# Patient Record
Sex: Female | Born: 1980 | Race: Black or African American | Hispanic: No | Marital: Married | Smoking: Never smoker
Health system: Southern US, Community
[De-identification: ages and names within clinical notes are randomized; demographics above are authoritative.]

## PROBLEM LIST (undated history)

## (undated) DIAGNOSIS — J3089 Other allergic rhinitis: Secondary | ICD-10-CM

## (undated) DIAGNOSIS — T7840XA Allergy, unspecified, initial encounter: Secondary | ICD-10-CM

## (undated) DIAGNOSIS — D259 Leiomyoma of uterus, unspecified: Secondary | ICD-10-CM

## (undated) DIAGNOSIS — J111 Influenza due to unidentified influenza virus with other respiratory manifestations: Secondary | ICD-10-CM

## (undated) DIAGNOSIS — K219 Gastro-esophageal reflux disease without esophagitis: Secondary | ICD-10-CM

## (undated) HISTORY — DX: Leiomyoma of uterus, unspecified: D25.9

## (undated) HISTORY — DX: Allergy, unspecified, initial encounter: T78.40XA

## (undated) HISTORY — DX: Gastro-esophageal reflux disease without esophagitis: K21.9

## (undated) HISTORY — PX: INTRAUTERINE DEVICE (IUD) INSERTION: SHX5877

---

## 2004-03-02 ENCOUNTER — Encounter: Payer: Self-pay | Admitting: Family Medicine

## 2004-03-02 LAB — CONVERTED CEMR LAB

## 2004-09-30 ENCOUNTER — Ambulatory Visit: Payer: Self-pay | Admitting: Family Medicine

## 2005-06-14 ENCOUNTER — Ambulatory Visit: Payer: Self-pay | Admitting: Family Medicine

## 2005-06-19 ENCOUNTER — Ambulatory Visit: Payer: Self-pay | Admitting: Family Medicine

## 2005-10-10 DIAGNOSIS — K219 Gastro-esophageal reflux disease without esophagitis: Secondary | ICD-10-CM | POA: Insufficient documentation

## 2005-10-10 DIAGNOSIS — K649 Unspecified hemorrhoids: Secondary | ICD-10-CM | POA: Insufficient documentation

## 2006-05-10 ENCOUNTER — Encounter: Payer: Self-pay | Admitting: Family Medicine

## 2006-05-10 ENCOUNTER — Ambulatory Visit: Payer: Self-pay | Admitting: Family Medicine

## 2007-05-17 ENCOUNTER — Ambulatory Visit: Payer: Self-pay | Admitting: Family Medicine

## 2007-07-16 ENCOUNTER — Ambulatory Visit: Payer: Self-pay | Admitting: Family Medicine

## 2007-07-17 ENCOUNTER — Encounter: Payer: Self-pay | Admitting: Family Medicine

## 2007-07-17 LAB — CONVERTED CEMR LAB: Clue Cells Wet Prep HPF POC: NONE SEEN

## 2008-02-27 ENCOUNTER — Ambulatory Visit: Payer: Self-pay | Admitting: Family Medicine

## 2008-06-15 ENCOUNTER — Other Ambulatory Visit: Admission: RE | Admit: 2008-06-15 | Discharge: 2008-06-15 | Payer: Self-pay | Admitting: Family Medicine

## 2008-06-15 ENCOUNTER — Encounter: Payer: Self-pay | Admitting: Family Medicine

## 2008-06-15 ENCOUNTER — Ambulatory Visit: Payer: Self-pay | Admitting: Family Medicine

## 2008-06-15 DIAGNOSIS — D239 Other benign neoplasm of skin, unspecified: Secondary | ICD-10-CM | POA: Insufficient documentation

## 2008-06-15 LAB — CONVERTED CEMR LAB
Bilirubin Urine: NEGATIVE
Blood in Urine, dipstick: NEGATIVE
Glucose, Urine, Semiquant: NEGATIVE
Nitrite: NEGATIVE
Urobilinogen, UA: 0.2

## 2008-06-16 ENCOUNTER — Encounter: Payer: Self-pay | Admitting: Family Medicine

## 2008-06-16 LAB — CONVERTED CEMR LAB: Trich, Wet Prep: NONE SEEN

## 2008-06-18 LAB — CONVERTED CEMR LAB: Pap Smear: NORMAL

## 2008-06-24 ENCOUNTER — Telehealth: Payer: Self-pay | Admitting: Family Medicine

## 2008-08-21 ENCOUNTER — Telehealth: Payer: Self-pay | Admitting: Family Medicine

## 2009-03-19 ENCOUNTER — Ambulatory Visit: Payer: Self-pay | Admitting: Family Medicine

## 2009-03-24 LAB — CONVERTED CEMR LAB
AST: 23 units/L (ref 0–37)
Albumin: 4.2 g/dL (ref 3.5–5.2)
Alkaline Phosphatase: 33 units/L — ABNORMAL LOW (ref 39–117)
BUN: 8 mg/dL (ref 6–23)
Creatinine, Ser: 0.87 mg/dL (ref 0.40–1.20)
Glucose, Bld: 80 mg/dL (ref 70–99)
HDL: 82 mg/dL (ref >39)
LDL Cholesterol: 96 mg/dL (ref 0–99)
TSH: 1.131 microintl units/mL (ref 0.350–4.500)
Total Bilirubin: 0.3 mg/dL (ref 0.3–1.2)
Total CHOL/HDL Ratio: 2.3
Triglycerides: 56 mg/dL (ref <150)
VLDL: 11 mg/dL (ref 0–40)

## 2009-03-29 ENCOUNTER — Telehealth: Payer: Self-pay | Admitting: Family Medicine

## 2009-04-03 ENCOUNTER — Ambulatory Visit: Payer: Self-pay | Admitting: Family Medicine

## 2009-04-03 DIAGNOSIS — B3731 Acute candidiasis of vulva and vagina: Secondary | ICD-10-CM | POA: Insufficient documentation

## 2009-04-03 DIAGNOSIS — B373 Candidiasis of vulva and vagina: Secondary | ICD-10-CM | POA: Insufficient documentation

## 2009-04-03 DIAGNOSIS — N760 Acute vaginitis: Secondary | ICD-10-CM | POA: Insufficient documentation

## 2009-04-19 ENCOUNTER — Ambulatory Visit: Payer: Self-pay | Admitting: Occupational Medicine

## 2009-04-21 ENCOUNTER — Encounter: Payer: Self-pay | Admitting: Occupational Medicine

## 2009-04-26 ENCOUNTER — Telehealth: Payer: Self-pay | Admitting: Occupational Medicine

## 2009-11-02 ENCOUNTER — Ambulatory Visit: Payer: Self-pay | Admitting: Family Medicine

## 2009-11-02 DIAGNOSIS — J019 Acute sinusitis, unspecified: Secondary | ICD-10-CM | POA: Insufficient documentation

## 2010-02-01 NOTE — Assessment & Plan Note (Signed)
Summary: CPX- with a pap   Vital Signs:  Patient profile:   30 year old female Height:      65.5 inches Weight:      155 pounds BMI:     25.49 Pulse rate:   65 / minute BP sitting:   100 / 70  (left arm) Cuff size:   regular  Vitals Entered By: Kathlene November (March 19, 2009 8:29 AM) CC: CPE no pap   Primary Care Provider:  Linford Arnold, C  CC:  CPE no pap.  History of Present Illness: has had some nasal congestion, runny nose, and sneezing for about 2 days.  Did take a claritin yesterday and felt some better. No fever. Does have a scratchy throat.    Feels the need for BM after eating. C/o of some abdominal cramping and gas. Feels she gets Haematologist and denies any constipation or diarrhea or blood in the stools. has had some mucous.   Current Medications (verified): 1)  Tri-Sprintec 0.18/0.215/0.25 Mg-35 Mcg Tabs (Norgestim-Eth Estrad Triphasic) .... Take 1 Tablet By Mouth Once A Day  Allergies (verified): No Known Drug Allergies  Comments:  Nurse/Medical Assistant: The patient's medications and allergies were reviewed with the patient and were updated in the Medication and Allergy Lists. 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  Has 45 yr old son. Never smoked.  Drinks 1-2 EtoH per month.  Daily Caffeine.  No drugs.  +  regular exercise.  Review of Systems  The patient denies anorexia, fever, weight loss, weight gain, vision loss, decreased hearing, hoarseness, chest pain, syncope, dyspnea on exertion, peripheral edema, prolonged cough, headaches, hemoptysis, abdominal pain, melena, hematochezia, severe indigestion/heartburn, hematuria, incontinence, genital sores, muscle  weakness, suspicious skin lesions, transient blindness, difficulty walking, depression, unusual weight change, abnormal bleeding, enlarged lymph nodes, and breast masses.         Mucous in the stool. No travel outside the country. Does have hemorroids. No constipation or diarrhea.   Physical Exam  General:  Well-developed,well-nourished,in no acute distress; alert,appropriate and cooperative throughout examination Head:  Normocephalic and atraumatic without obvious abnormalities. No apparent alopecia or balding. Eyes:  No corneal or conjunctival inflammation noted. EOMI. Perrla.  Ears:  External ear exam shows no significant lesions or deformities.  Otoscopic examination reveals clear canals, tympanic membranes are intact bilaterally without bulging, retraction, inflammation or discharge. Hearing is grossly normal bilaterally. Nose:  External nasal examination shows no deformity or inflammation. Nasal mucosa are pink and moist without lesions or exudates. Trubinate are very swollen.  Mouth:  Oral mucosa and oropharynx without lesions or exudates.  Teeth in good repair. Neck:  No deformities, masses, or tenderness noted. Chest Wall:  No deformities, masses, or tenderness noted. Breasts:  No mass, nodules, thickening, tenderness, bulging, retraction, inflamation, nipple discharge or skin changes noted.   Lungs:  Normal respiratory effort, chest expands symmetrically. Lungs are clear to auscultation, no crackles or wheezes. Heart:  Normal rate and regular rhythm. S1 and S2 normal without gallop, murmur, click, rub or other extra sounds. Abdomen:  Bowel sounds positive,abdomen soft and non-tender without masses, organomegaly or hernias noted. Msk:  No deformity or scoliosis noted of thoracic or lumbar spine.   Pulses:  R and L carotid,radial,dorsalis pedis  and posterior tibial pulses are full and equal bilaterally Extremities:  No clubbing, cyanosis, edema, or deformity noted with normal full range  of motion of all joints.   Neurologic:  No cranial nerve deficits noted. Station and gait are normal.DTRs are symmetrical throughout. Sensory, motor and coordinative functions appear intact. Skin:  no rashes.   Cervical Nodes:  No lymphadenopathy noted Axillary Nodes:  No palpable lymphadenopathy Psych:  Cognition and judgment appear intact. Alert and cooperative with normal attention span and concentration. No apparent delusions, illusions, hallucinations   Impression & Recommendations:  Problem # 1:  HEALTH SCREENING (ICD-V70.0) Due for pap next year. Screening labs are due.  Vaccines are up to date.   Recommend trial of claritin or zyrtec for allergic sxs .  Keep up exercise and healthy diet.  Orders: T-Comprehensive Metabolic Panel [id number]) T-Lipid Profile [id number]) T-TSH [id number])  Problem # 2:  ABDOMINAL CRAMPS (ICD-789.00) Will rule out gluten intolerance and lactose intolerance. Also recommend start a probiotic, called align.   Orders: T-Gliadin Peptide Antibodies, IgG, LgA ([id number]) T- * Misc. Laboratory test [id number])  Complete Medication List: 1)  Tri-sprintec 0.18/0.215/0.25 Mg-35 Mcg Tabs (Norgestim-eth estrad triphasic) .... Take 1 tablet by mouth once a day  Patient Instructions: 1)  Keep up the great exercise and healthy diet 2)  We will call you with your lab results  3)  Take calcium +vitamin D daily or 4 servings of dairy a day.  Prescriptions: TRI-SPRINTEC 0.18/0.215/0.25 MG-35 MCG TABS (NORGESTIM-ETH ESTRAD TRIPHASIC) Take 1 tablet by mouth once a day  #84 x 3   Entered and Authorized by:   Nani Gasser MD   Signed by:   Nani Gasser MD on 03/19/2009   Method used:   [facility] to        [facility] [phone]* (retail)       [address] [place], [place]  [address]       Ph: [phone] or [phone]       Fax: [phone]   RxID:   [id number]   Flu Vaccine Result Date:  10/02/2008 Flu Vaccine Result:   given Flu Vaccine Next Due:  1 yr

## 2010-02-01 NOTE — Assessment & Plan Note (Signed)
Summary: VAGINAL DISCHARGE/TJ   Vital Signs:  Patient Profile:   30 Years Old Female CC:      intermitent vaginal discharge X 1 month Height:     65.5 inches (166.37 cm) Weight:      160 pounds O2 Sat:      100 % O2 treatment:    Room Air Temp:     98.8 degrees F oral Pulse rate:   62 / minute Pulse rhythm:   regular Resp:     14 per minute BP sitting:   109 / 73  (right arm) Cuff size:   regular  Pt. in pain?   no  Vitals Entered By: Lajean Saver RN (April 19, 2009 5:23 PM)                   Updated Prior Medication List: TRI-SPRINTEC 0.18/0.215/0.25 MG-35 MCG TABS (NORGESTIM-ETH ESTRAD TRIPHASIC) Take 1 tablet by mouth once a day FLUCONAZOLE 150 MG TABS (FLUCONAZOLE) Take 1 tablet by mouth once a day x 1 METRONIDAZOLE 500 MG TABS (METRONIDAZOLE) One by mouth bid  Current Allergies (reviewed today): No known allergies History of Present Illness Chief Complaint: intermitent vaginal discharge X 1 month History of Present Illness: Very pleasant 29 YO presents with a painless thick white vaginal discharge intermittently for the past month.   She was treated successfully with diflucan, teraxol 7 vaginal cream and flagyl.  Denies thin, smelly discharge.   Scant itching.  No pelvic pain or fever.   No dysuria.    Also wants to get Pap smear while we are doing the pelvic exam.   REVIEW OF SYSTEMS Constitutional Symptoms      Denies fever, chills, night sweats, weight loss, weight gain, and fatigue.  Eyes       Denies change in vision, eye pain, eye discharge, glasses, contact lenses, and eye surgery. Ear/Nose/Throat/Mouth       Denies hearing loss/aids, change in hearing, ear pain, ear discharge, dizziness, frequent runny nose, frequent nose bleeds, sinus problems, sore throat, hoarseness, and tooth pain or bleeding.  Respiratory       Denies dry cough, productive cough, wheezing, shortness of breath, asthma, bronchitis, and emphysema/COPD.  Cardiovascular       Denies  murmurs, chest pain, and tires easily with exhertion.    Gastrointestinal       Denies stomach pain, nausea/vomiting, diarrhea, constipation, blood in bowel movements, and indigestion. Genitourniary       Complains of blood or discharge from vagina.      Denies painful urination, kidney stones, and loss of urinary control.      Comments: white Neurological       Denies paralysis, seizures, and fainting/blackouts. Musculoskeletal       Denies muscle pain, joint pain, joint stiffness, decreased range of motion, redness, swelling, muscle weakness, and gout.  Skin       Denies bruising, unusual mles/lumps or sores, and hair/skin or nail changes.  Psych       Denies mood changes, temper/anger issues, anxiety/stress, speech problems, depression, and sleep problems. Other Comments: patient was recently treated for vaginal infection and yeast infection 2 weeks ago. Majority of her symptoms have cleared but she still c/o white discharge and painful intercourse at times   Past History:  Past Medical History: Reviewed history from 10/10/2005 and no changes required. G1 P1  Past Surgical History: Reviewed history from 10/10/2005 and no changes required. _  Family History: Reviewed history from 03/19/2009 and no changes  required. Mother-Psych problems Parents, siblings-HTN  Social History: Reviewed history from 03/19/2009 and no changes required. 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  Has 36 yr old son. Never smoked.  Drinks 1-2 EtoH per month.  Daily Caffeine.  No drugs.  +  regular exercise. Physical Exam Heart: regular rate and  rhythm, no murmur Abdomen: soft, non-tender without obvious organomegaly GU: normal Pelvic exam.:   Cervix visualized.   Thick white discharge noted.  Cervix appears normal.  No odors.    Plan New Medications/Changes: FLUCONAZOLE 150 MG TABS (FLUCONAZOLE) Take 1 tablet by mouth once a day x 1  #1 x 3, 04/19/2009, Kathrine Haddock MD  New  Orders: Est. Patient Level III [99213] T-KOH Prep Fungal [[id number]] T-Pap Smear, Thin Prep [09811] Planning Comments:   diflucan 150 once for yeast infection Recommend pro biotic yogurt Recommend cotton panties Pap Smear done at patient's request.  Follow up as needed.   The patient and/or caregiver has been counseled thoroughly with regard to medications prescribed including dosage, schedule, interactions, rationale for use, and possible side effects and they verbalize understanding.  Diagnoses and expected course of recovery discussed and will return if not improved as expected or if the condition worsens. Patient and/or caregiver verbalized understanding.  Prescriptions: FLUCONAZOLE 150 MG TABS (FLUCONAZOLE) Take 1 tablet by mouth once a day x 1  #1 x 3   Entered and Authorized by:   Kathrine Haddock MD   Signed by:   Kathrine Haddock MD on 04/19/2009   Method used:   [facility]ically to        [facility] [phone]* (retail)       [address] [place], [place]  [address]       Ph: [phone] or [phone]       Fax: [phone]   RxID:   [id number]

## 2010-02-01 NOTE — Assessment & Plan Note (Signed)
Summary: Sinusitis   Vital Signs:  Patient profile:   30 year old female Height:      65.5 inches Weight:      158 pounds Temp:     99.0 degrees F oral Pulse rate:   63 / minute BP sitting:   115 / 78  (right arm) Cuff size:   regular  Vitals Entered By: Avon Gully CMA, Duncan Dull) (November 02, 2009 8:26 AM) CC: sore throat since wed, congestion and runny nose   Primary Care Provider:  Linford Arnold, C  CC:  sore throat since wed and congestion and runny nose.  History of Present Illness: sore throat since wed (7 days), congestion and runny nose. ST resolved initally but now has started again.  No fever. Some facial pressure and pain on the left side.  No nausea, change in appetite or loose stools.  Felt like somithing in her chest. Slight cough and sneezing.  DRy cough.  Tried several OTC cough meds - none helping. Feels like she is getting someworse. No fatigue or myalgias.   Current Medications (verified): 1)  Tri-Sprintec 0.18/0.215/0.25 Mg-35 Mcg Tabs (Norgestim-Eth Estrad Triphasic) .... Take 1 Tablet By Mouth Once A Day  Allergies (verified): No Known Drug Allergies  Comments:  Nurse/Medical Assistant: The patient's medications and allergies were reviewed with the patient and were updated in the Medication and Allergy Lists. Avon Gully CMA, Duncan Dull) (November 02, 2009 8:27 AM)  Physical Exam  General:  Well-developed,well-nourished,in no acute distress; alert,appropriate and cooperative throughout examination Head:  Normocephalic and atraumatic without obvious abnormalities. No apparent alopecia or balding. Eyes:  No corneal or conjunctival inflammation noted. EOMI. Perrla.  Ears:  External ear exam shows no significant lesions or deformities.  Otoscopic examination reveals clear canals, tympanic membranes are intact bilaterally without bulging, retraction, inflammation or discharge. Hearing is grossly normal bilaterally. Nose:  External nasal examination shows no  deformity or inflammation. Nasal mucosa are pink and moist without lesions or exudates. Mouth:  Oral mucosa and oropharynx without lesions or exudates.  Teeth in good repair. Neck:  No deformities, masses, or tenderness noted. Lungs:  Normal respiratory effort, chest expands symmetrically. Lungs are clear to auscultation, no crackles or wheezes. Heart:  Normal rate and regular rhythm. S1 and S2 normal without gallop, murmur, click, rub or other extra sounds. Skin:  no rashes.   Cervical Nodes:  No lymphadenopathy noted Psych:  Cognition and judgment appear intact. Alert and cooperative with normal attention span and concentration. No apparent delusions, illusions, hallucinations   Impression & Recommendations:  Problem # 1:  SINUSITIS - ACUTE-NOS (ICD-461.9) If not feeling bettter in a couple of days then let me know.  The following medications were removed from the medication list:    Metronidazole 500 Mg Tabs (Metronidazole) ..... One by mouth bid Her updated medication list for this problem includes:    Amoxicillin 875 Mg Tabs (Amoxicillin) .Marland Kitchen... Take 1 tablet by mouth two times a day for 10 days  Instructed on treatment. Call if symptoms persist or worsen.   Complete Medication List: 1)  Tri-sprintec 0.18/0.215/0.25 Mg-35 Mcg Tabs (Norgestim-eth estrad triphasic) .... Take 1 tablet by mouth once a day 2)  Amoxicillin 875 Mg Tabs (Amoxicillin) .... Take 1 tablet by mouth two times a day for 10 days  Patient Instructions: 1)  Call if get worse.  Can you OTC cough medicines if needed.  Prescriptions: AMOXICILLIN 875 MG TABS (AMOXICILLIN) Take 1 tablet by mouth two times a day for  10 days  #20 x 0   Entered and Authorized by:   Nani Gasser MD   Signed by:   Nani Gasser MD on 11/02/2009   Method used:   [facility]ronically to        [facility] [phone]* (retail)       [address] [place], [place]  [address]       Ph: [phone] or [phone]       Fax:  [phone]   RxID:   [id number]    Orders Added: 1)  Est. Patient Level III [36644]   Immunization History:  Influenza Immunization History:    Influenza:  historical (10/11/2009)   Immunization History:  Influenza Immunization History:    Influenza:  Historical (10/11/2009)   Immunization History:  Influenza Immunization History:    Influenza:  historical (10/11/2009)

## 2010-02-01 NOTE — Assessment & Plan Note (Signed)
Summary: Yeast infection cleared up rm 1   Vital Signs:  Patient Profile:   30 Years Old Female CC:      Yeast infection cleard up? LMP:     03/30/2009 Height:     65.5 inches (166.37 cm) Weight:      158 pounds O2 Sat:      99 % O2 treatment:    Room Air Temp:     98.1 degrees F oral Pulse rate:   69 / minute Pulse rhythm:   regular Resp:     16 per minute BP sitting:   108 / 74  (right arm) Cuff size:   regular  Vitals Entered By: Areta Haber CMA (April 03, 2009 10:40 AM)  Menstrual History: LMP (date): 03/30/2009 LMP - Character: light                  Current Allergies: No known allergies History of Present Illness Chief Complaint: Yeast infection cleard up? History of Present Illness: Subjective:  Patient complains of two week history of "clumpy" whitish vaginal discharge without pain.  She tried OTC Monistat without complete resolution, and then was prescribed Diflucan (single dose) 5 days ago.  She then started her menses, which is now almost ended, but she believes she still has some mild infection.  No urinary symptoms.  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  Eyes       Denies change in vision, eye pain, eye discharge, glasses, contact lenses, and eye surgery. Ear/Nose/Throat/Mouth       Denies hearing loss/aids,  change in hearing, ear pain, ear discharge, dizziness, frequent runny nose, frequent nose bleeds, sinus problems, sore throat, hoarseness, and tooth pain or bleeding.  Respiratory       Denies dry cough, productive cough, wheezing, shortness of breath, asthma, bronchitis, and emphysema/COPD.  Cardiovascular       Denies murmurs, chest pain, and tires easily with exhertion.    Gastrointestinal       Denies stomach pain, nausea/vomiting, diarrhea, constipation, blood in bowel movements, and indigestion. Genitourniary       Complains of blood or discharge from vagina.      Denies painful urination, kidney stones, and loss of urinary control.      Comments: slight Neurological       Denies paralysis, seizures, and fainting/blackouts. Musculoskeletal       Denies muscle pain, joint pain, joint stiffness, decreased range of motion, redness, swelling, muscle weakness, and gout.  Skin       Denies bruising, unusual mles/lumps or sores, and hair/skin or nail changes.  Psych       Denies mood changes, temper/anger issues, anxiety/stress, speech problems, depression, and sleep problems. Other Comments: Pt just wants to make sure yeast infection has cleared up.     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  Has 52 yr old son. Never smoked.  Drinks 1-2 EtoH per month.  Daily Caffeine.  No drugs.  +  regular exercise.  Risk Factors: Smoking Status: never (05/10/2006)   Objective:  Appearance:  Patient appears healthy, stated age, and in no acute distress  Lungs:  Clear to auscultation.  Breath sounds are equal.  Heart:  Regular rate and rhythm without murmurs, rubs, or gallops.  Abdomen:  Nontender without masses or  hepatosplenomegaly.  Bowel sounds are present.  No CVA or flank tenderness.  Genitourinary:  Vulva appears normal without lesions or erythema.  Vagina has normal mucosae without lesions.  There is a white curd-like discharge in the vault next to cervix.   Cervix appears normal without lesions.  There is small amount of residual menstrual blood in cervical os.   No cervical motion tenderness present. Uterus is small and nontender.  Adnexae are nontender without massess.  pH of vaginal discharge 5.0 KOH and Wet Prep:  branching yeast present.  Clue cells present. Assessment New Problems: BACTERIAL VAGINITIS (ICD-616.10) CANDIDIASIS, VAGINAL (ICD-112.1)  PERSISTENT CANDIDA VAGINITIS WITH BACTERIAL VAGINOSIS  Plan New Medications/Changes: TERAZOL 7 0.4 % CREA (TERCONAZOLE) 1 applicatorful PV at bedtime for one week  #1 tube x 0, 04/03/2009, Donna Christen MD METRONIDAZOLE 500 MG TABS (METRONIDAZOLE) One by mouth bid  #14 x 0, 04/03/2009, Donna Christen MD  New Orders: New Patient Level III [99203] [facility] [[id number]] Planning Comments:   Will treat with Terazol 7 vaginal cream for one week, and oral Flagyl for one week. Follow-up with PCP   The patient and/or caregiver has been counseled thoroughly with regard to medications prescribed including dosage, schedule, interactions, rationale for use, and possible side effects and they verbalize understanding.  Diagnoses and expected course of recovery discussed and will return if not improved as expected or if the condition worsens. Patient and/or caregiver verbalized understanding.  Prescriptions: TERAZOL 7 0.4 % CREA (TERCONAZOLE) 1 applicatorful PV at bedtime for one week  #1 tube x 0   Entered and Authorized by:   Donna Christen MD   Signed by:   Donna Christen MD on 04/03/2009   Method used:   Print then Give to Patient   RxID:   [id number] METRONIDAZOLE 500 MG TABS (METRONIDAZOLE) One by mouth bid  #14 x 0   Entered and Authorized  by:   Donna Christen MD   Signed by:   Donna Christen MD on 04/03/2009   Method used:   Print then Give to Patient   RxID:   [id number]

## 2010-02-01 NOTE — Progress Notes (Signed)
Summary: Yeast infection  Phone Note Call from Patient Call back at [phone]   Caller: Patient Call For: Nani Gasser MD Summary of Call: Pt calls and states has yeast infection and used the OTC med but did not help all that much and wanted to know if you would call her in [facility] [address] [facility] [place] Initial call taken by: Kathlene November,  March 29, 2009 10:21 AM  Follow-up for Phone Call        Pt notified med sent Follow-up by: Kathlene November,  March 29, 2009 10:55 AM    New/Updated Medications: FLUCONAZOLE 150 MG TABS (FLUCONAZOLE) Take 1 tablet by mouth once a day x 1 Prescriptions: FLUCONAZOLE 150 MG TABS (FLUCONAZOLE) Take 1 tablet by mouth once a day x 1  #1 x 0   Entered and Authorized by:   Nani Gasser MD   Signed by:   Nani Gasser MD on 03/29/2009   Method used:   [facility]ronically to        [facility] [phone]* (retail)       [address] [place], [place]  [address]       Ph: [phone] or [phone]       Fax: [phone]   RxID:   [id number]

## 2010-02-01 NOTE — Progress Notes (Signed)
  Phone Note Call from Patient   Caller: Patient Reason for Call: Talk to Nurse Summary of Call: Paitent wants the results back from the test that was done. Please call and give result to patient [phone]. Nh  Called number listed above.   This number is disconnected.;  I called the work and home number and left a message that her results were normal.   Halford Chessman MD>  Initial call taken by: Dannette Barbara,  April 26, 2009 8:16 AM

## 2010-06-14 ENCOUNTER — Encounter: Payer: Self-pay | Admitting: Emergency Medicine

## 2010-06-14 ENCOUNTER — Inpatient Hospital Stay (INDEPENDENT_AMBULATORY_CARE_PROVIDER_SITE_OTHER)
Admission: RE | Admit: 2010-06-14 | Discharge: 2010-06-14 | Disposition: A | Discharge status: Home / Self Care | Payer: BC Managed Care – PPO | Source: Ambulatory Visit | Attending: Emergency Medicine | Admitting: Emergency Medicine

## 2010-06-14 DIAGNOSIS — N760 Acute vaginitis: Secondary | ICD-10-CM

## 2010-12-05 NOTE — Progress Notes (Signed)
Summary: possible yeast infection rm 5   Vital Signs:  Patient Profile:   30 Years Old Female CC:      vaginal d/c x 1 1/2 wk LMP:     05/31/2010 Height:     65.5 inches (166.37 cm) Weight:      163.75 pounds O2 Sat:      99 % O2 treatment:    Room Air Temp:     99.4 degrees F oral Pulse rate:   16 / minute Resp:     16 per minute BP sitting:   119 / 83  (left arm) Cuff size:   regular  Vitals Entered By: Clemens Catholic LPN (June 14, 2010 4:20 PM)  Menstrual History: LMP (date): 05/31/2010                  Updated Prior Medication List: TRI-SPRINTEC 0.18/0.215/0.25 MG-35 MCG TABS (NORGESTIM-ETH ESTRAD TRIPHASIC) Take 1 tablet by mouth once a day  Current Allergies (reviewed today): No known allergies History of Present Illness History from: patient Chief Complaint: vaginal d/c x 1 1/2 wk History of Present Illness: Vaginal d/c for 10 days.  Went to gyn who gave her diflucan which helped a little.  She has a h/o yeast and BV in the past.  White discharge, no odor, mildly irritating.  She is monogamous with her husband with no h/o STD.  No F/C/N/V, no UTI symptoms, no back pain or abd pain.  REVIEW OF SYSTEMS Constitutional Symptoms      Denies fever, chills, night sweats, weight loss, weight gain, and fatigue.  Eyes       Denies change in vision, eye pain, eye discharge, glasses, contact lenses, and eye surgery. Ear/Nose/Throat/Mouth       Denies hearing loss/aids, change in hearing, ear pain, ear discharge, dizziness, frequent runny nose, frequent nose bleeds, sinus problems, sore throat, hoarseness, and tooth pain or bleeding.  Respiratory       Denies dry cough, productive cough, wheezing, shortness of breath, asthma, bronchitis, and emphysema/COPD.  Cardiovascular       Denies murmurs, chest pain, and tires easily with exhertion.    Gastrointestinal       Denies stomach pain, nausea/vomiting, diarrhea, constipation, blood in bowel movements, and  indigestion. Genitourniary       Complains of blood or discharge from vagina.      Denies painful urination, kidney stones, and loss of urinary control. Neurological       Denies paralysis, seizures, and fainting/blackouts. Musculoskeletal       Denies muscle pain, joint pain, joint stiffness, decreased range of motion, redness, swelling, muscle weakness, and gout.  Skin       Denies bruising, unusual mles/lumps or sores, and hair/skin or nail changes.  Psych       Denies mood changes, temper/anger issues, anxiety/stress, speech problems, depression, and sleep problems. Other Comments: pt c/o a white odorless vaginal d/c x 1 1/2 wk. she called her GYN last wk and was given diflucan which helped with the irritation but she still has the d/c. she has a hx of BV. no fever. no OTC meds.   Past History:  Past Medical History: Reviewed history from 10/10/2005 and no changes required. G1 P1  Past Surgical History: _ Denies surgical history  Family History: Reviewed history from 03/19/2009 and no changes required. Mother-Psych problems Parents, siblings-HTN Family History Diabetes 1st degree relative Family History High cholesterol obesity  Social History: Reviewed history from 03/19/2009 and no changes required.  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  Has 65 yr old son. Never smoked.  Drinks 1-2 EtoH per month.  Daily Caffeine.  No drugs.  +  regular exercise. Physical Exam General appearance: well developed, well nourished, no acute distress GU: deferred MSE: oriented to time, place, and person Assessment New Problems: FAMILY HISTORY DIABETES 1ST DEGREE RELATIVE (ICD-V18.0)   Plan New Medications/Changes: FLUCONAZOLE 150 MG TABS (FLUCONAZOLE) 1 by mouth x1 dose  #1 x 0, 06/14/2010, Hoyt Koch MD METRONIDAZOLE 500 MG TABS (METRONIDAZOLE) 1 by mouth two times a day for 7 days  #14 x 0, 06/14/2010, Hoyt Koch MD  New Orders: Est. Patient Level  II [16109] Planning Comments:   Will treat for BV with Flagyl (no EtOH) and 1 more dose of diflucan just in case there was incomplete clearing.  If still symptoms, will need to KOH/wet prep.  Increase hydration.    The patient and/or caregiver has been counseled thoroughly with regard to medications prescribed including dosage, schedule, interactions, rationale for use, and possible side effects and they verbalize understanding.  Diagnoses and expected course of recovery discussed and will return if not improved as expected or if the condition worsens. Patient and/or caregiver verbalized understanding.  Prescriptions: FLUCONAZOLE 150 MG TABS (FLUCONAZOLE) 1 by mouth x1 dose  #1 x 0   Entered and Authorized by:   Hoyt Koch MD   Signed by:   Hoyt Koch MD on 06/14/2010   Method used:   Print then Give to Patient   RxID:   [id number] METRONIDAZOLE 500 MG TABS (METRONIDAZOLE) 1 by mouth two times a day for 7 days  #14 x 0   Entered and Authorized by:   Hoyt Koch MD   Signed by:   Hoyt Koch MD on 06/14/2010   Method used:   Print then Give to Patient   RxID:   [id number]   Orders Added: 1)  Est. Patient Level II [29528]

## 2011-04-03 ENCOUNTER — Ambulatory Visit (INDEPENDENT_AMBULATORY_CARE_PROVIDER_SITE_OTHER): Payer: BC Managed Care – PPO | Admitting: Family Medicine

## 2011-04-03 VITALS — BP 118/69 | HR 63 | Temp 98.3°F | Ht 66.0 in | Wt 160.0 lb

## 2011-04-03 DIAGNOSIS — J329 Chronic sinusitis, unspecified: Secondary | ICD-10-CM

## 2011-04-03 DIAGNOSIS — J40 Bronchitis, not specified as acute or chronic: Secondary | ICD-10-CM

## 2011-04-03 MED ORDER — AMOXICILLIN-POT CLAVULANATE 875-125 MG PO TABS: 1.0000 | ORAL_TABLET | Freq: Two times a day (BID) | ORAL | Status: AC

## 2011-04-03 MED ORDER — AMBULATORY NON FORMULARY MEDICATION: Status: DC

## 2011-04-03 MED ORDER — PHENYLEPHRINE-DM-GG-APAP 5-10-200-325 MG/10ML PO LIQD: 10.0000 mL | Freq: Four times a day (QID) | ORAL | Status: DC | PRN

## 2011-04-03 NOTE — Patient Instructions (Signed)
Discharge Instructions Browse by Alphabet A B C D E F G H I J K L M N O P Q R S T U V W X Y Z  Browse by Category All Documents Allergy and Immunology Anesthesiology Bariatrics Bioterrorism Cardiology Critical Care Dentistry Dermatology Diabetes Dietary Easy-to-Read Emergency Medicine Endocrinology ENT Family Medicine Forms Gastroenterology Geriatrics Infectious Disease Internal Medicine Labs and Tests Neonatology Nephrology Neurology Obstetrics and Gynecology Oncology Ophthalmology Orthopedics Pediatrics Pharmacology Physical Medicine and Rehabilitation Podiatry Preventative Medicine Procedures Psychiatry Pulmonary Medicine Radiology Rheumatology Surgery Urology Drug Information Sheets All Drug Information Sheets  Browse by Alphabet A B C D E F G H I J K L M N O P Q R S T U V W X Y Z Discharge Instructions Browse by Alphabet A B C D E F G H I J K L M N O P Q R S T U V W X Y Z  Browse by Category All Documents Allergy and Immunology Anesthesiology [facility] Bioterrorism Cardiology Critical Care Dentistry Dermatology Diabetes Dietary Easy-to-Read Emergency Medicine Endocrinology ENT Family Medicine Forms Gastroenterology Geriatrics Infectious Disease Internal Medicine Labs and Tests Neonatology Nephrology Neurology Obstetrics and Gynecology Oncology Ophthalmology Orthopedics Pediatrics Pharmacology Physical Medicine and Rehabilitation Podiatry Preventative Medicine Procedures Psychiatry Pulmonary Medicine Radiology Rheumatology Surgery Urology Drug Information Sheets All Drug Information Sheets  Browse by Alphabet A B C D E F G H I J K L M N O P Q R S T U V W X Y Z Acute Bronchitis You have acute bronchitis. This means you have a chest cold. The airways in your lungs are red and sore (inflamed). Acute means it is sudden onset.   CAUSES Bronchitis is most often caused by the same virus that causes a cold. SYMPTOMS     Body aches.   Chest congestion.   Chills.   Cough.   Fever.   Shortness of breath.   Sore throat.  TREATMENT   Acute bronchitis is usually treated with rest, fluids, and medicines for relief of fever or cough. Most symptoms should go away after a few days or a week. Increased fluids may help thin your secretions and will prevent dehydration. Your caregiver may give you an inhaler to improve your symptoms. The inhaler reduces shortness of breath and helps control cough. You can take over-the-counter pain relievers or cough medicine to decrease coughing, pain, or fever. A cool-air vaporizer may help thin bronchial secretions and make it easier to clear your chest. Antibiotics are usually not needed but can be prescribed if you smoke, are seriously ill, have chronic lung problems, are elderly, or you are at higher risk for developing complications. Allergies and asthma can make bronchitis worse. Repeated episodes of bronchitis may cause longstanding lung problems. Avoid smoking and secondhand smoke. Exposure to cigarette smoke or irritating chemicals will make bronchitis worse. If you are a cigarette smoker, consider using nicotine gum or skin patches to help control withdrawal symptoms. Quitting smoking will help your lungs heal faster. Recovery from bronchitis is often slow, but you should start feeling better after 2 to 3 days. Cough from bronchitis frequently lasts for 3 to 4 weeks. To prevent another bout of acute bronchitis:  Quit smoking.   Wash your hands frequently to get rid of viruses or use a hand sanitizer.   Avoid other people with cold or virus symptoms.   Try not to touch your hands to your mouth, nose, or eyes.  SEEK IMMEDIATE MEDICAL CARE IF:  You develop increased fever, chills, or chest pain.   You have severe shortness of breath or bloody sputum.   You develop dehydration, fainting, repeated vomiting, or a severe headache.   You have no improvement after 1  week of treatment or you get worse.  MAKE SURE YOU:    Understand these instructions.   Will watch your condition.   Will get help right away if you are not doing well or get worse.  Document Released: 01/27/2004 Document Revised: 12/08/2010 Document Reviewed: 04/13/2010 [facility] Patient Information 2012 [place], [place].Sinusitis Sinuses are air pockets within the bones of your face. The growth of bacteria within a sinus leads to infection. The infection prevents the sinuses from draining. This infection is called sinusitis. SYMPTOMS   There will be different areas of pain depending on which sinuses have become infected.  The maxillary sinuses often produce pain beneath the eyes.   Frontal sinusitis may cause pain in the middle of the forehead and above the eyes.  Other problems (symptoms) include:  Toothaches.   Colored, pus-like (purulent) drainage from the nose.   Swelling, warmth, and tenderness over the sinus areas may be signs of infection.  TREATMENT   Sinusitis is most often determined by an exam.X-rays may be taken. If x-rays have been taken, make sure you obtain your results or find out how you are to obtain them. Your caregiver may give you medications (antibiotics). These are medications that will help kill the bacteria causing the infection. You may also be given a medication (decongestant) that helps to reduce sinus swelling.   HOME CARE INSTRUCTIONS    Only take over-the-counter or prescription medicines for pain, discomfort, or fever as directed by your caregiver.   Drink extra fluids. Fluids help thin the mucus so your sinuses can drain more easily.   Applying either moist heat or ice packs to the sinus areas may help relieve discomfort.   Use saline nasal sprays to help moisten your sinuses. The sprays can be found at your local drugstore.  SEEK IMMEDIATE MEDICAL CARE IF:  You have a fever.   You have increasing pain, severe headaches, or toothache.   You  have nausea, vomiting, or drowsiness.   You develop unusual swelling around the face or trouble seeing.  MAKE SURE YOU:    Understand these instructions.   Will watch your condition.   Will get help right away if you are not doing well or get worse.  Document Released: 12/19/2004 Document Revised: 12/08/2010 Document Reviewed: 07/18/2006 [facility] Patient Information 2012 [place], [place].

## 2011-04-03 NOTE — Progress Notes (Signed)
  Subjective:    Patient ID: Melanie Glass, female    DOB: 02/02/1980, 31 y.o.   MRN: [id number]  HPI  6 days of productive cough and nasal congestion. No fever. Feels tired. Worse at night. Using mucinex- helpos some. Says she feels she is getting worse. Some HA. Started in the sinuses. Took a claritin one night.    Review of Systems     Objective:   Physical Exam  Constitutional: She is oriented to person, place, and time. She appears well-developed and well-nourished.  HENT:  Head: Normocephalic and atraumatic.  Right Ear: External ear normal.  Left Ear: External ear normal.  Nose: Nose normal.  Mouth/Throat: Oropharynx is clear and moist.       TMs and canals are clear.   Eyes: Conjunctivae and EOM are normal. Pupils are equal, round, and reactive to light.  Neck: Neck supple. No thyromegaly present.  Cardiovascular: Normal rate, regular rhythm and normal heart sounds.   Pulmonary/Chest: Effort normal and breath sounds normal. She has no wheezes.  Lymphadenopathy:    She has no cervical adenopathy.  Neurological: She is alert and oriented to person, place, and time.  Skin: Skin is warm and dry.  Psychiatric: She has a normal mood and affect.          Assessment & Plan:  Sinusitis/Bronchitis - Since 6 days and she feels she is getting worse. Will tx with augmentin x 10 days for sinusitis.  Call if not better in one week.  Symptomatic care.

## 2011-06-19 ENCOUNTER — Ambulatory Visit (INDEPENDENT_AMBULATORY_CARE_PROVIDER_SITE_OTHER): Payer: BC Managed Care – PPO | Admitting: Family Medicine

## 2011-06-19 ENCOUNTER — Encounter: Payer: Self-pay | Admitting: Family Medicine

## 2011-06-19 VITALS — BP 113/70 | HR 75 | Wt 161.0 lb

## 2011-06-19 DIAGNOSIS — K645 Perianal venous thrombosis: Secondary | ICD-10-CM

## 2011-06-19 MED ORDER — LIDOCAINE-HYDROCORTISONE ACE 3-0.5 % RE CREA: 1.0000 | TOPICAL_CREAM | Freq: Two times a day (BID) | RECTAL | Status: DC

## 2011-06-19 NOTE — Patient Instructions (Signed)
Hemorrhoids Hemorrhoids are veins in the rectum that get big. These veins can get blocked. Blocked veins become puffy (swollen) and painful. HOME CARE  Eat more fiber.   Drink enough fluid to keep your pee (urine) clear or pale yellow.   Exercise often.   Avoid straining to poop (bowel movement).   Keep the butt area dry and clean.   Only take medicine as told by your doctor.  If your hemorrhoids are puffy and painful:  Take a warm bath for 20 to 30 minutes. Do this 3 to 4 times a day.   Place ice packs on the area. Use the ice packs between the baths.   Put ice in a plastic bag.   Place a towel between your skin and the bag.   Leave the ice on for 15 to 20 minutes, 3 to 4 times a day.   Do not use a donut-shaped pillow. Do not sit on the toilet for a long time.   Go to the bathroom when your body has the urge to poop. This is so you do not strain as much to poop.  GET HELP RIGHT AWAY IF:   You have increasing pain that is not controlled with medicine.   You have uncontrolled bleeding.   You cannot poop.   You have pain or puffiness outside the area of the hemorrhoids.   You have chills.   You have a temperature by mouth above 102 F (38.9 C), not controlled by medicine.  MAKE SURE YOU:   Understand these instructions.   Will watch your condition.   Will get help right away if you are not doing well or get worse.  Document Released: 09/28/2007 Document Revised: 12/08/2010 Document Reviewed: 09/28/2007 [employer] Patient Information 2012 [employer]. 

## 2011-06-19 NOTE — Progress Notes (Signed)
  Subjective:    Patient ID: Melanie Glass, female    DOB: 1980/02/15, 31 y.o.   MRN: [id number]  HPI Hemorrhoid flaring for one week. Using preparation H OTC and helping some.  Has been worse the last 3 days. Says they are swollen, painful, and no bleeding. They have bothered her on and off for years. She's never had any prescription topical medication to apply.   Review of Systems     Objective:   Physical Exam  She has a 1 cm swollen hemorrhoid at the Position at 9:00. She has a second hemorrhoid at about the 3:00 position that is not acutely inflamed. Normal rectal tone. No evidence of erythema or cellulitis.      Assessment & Plan:  Acutely thrombosed hemorrhoid - Discussed options of sitz baths, creams and lancing. She preferred to have the lesion limits today. I explained how we would lance the lesion and that sometimes we are unable to express any blood clots. Patient said she wanted to have the I&D. Then a prescription for Anamantel.  Continue with sitz baths at home. Call if not significantly improved in the next 2-3 days.  Incision Thrombosed Hemorrhoid Procedure Note  Pre-operative Diagnosis: Thrombosed external hemorrhoid  Post-operative Diagnosis: Thrombosed external hemorrhoid  Locations: 9:00 position  Indications: Painful external hemorrhoid. Explained surgical vs conservative treatment; surgical incision and drainage of clot usually works quickly to reduce pain and shorten healing time, but is uncomfortable to perform.  After discussion, the patient wishes to proceed with this procedure.  Anesthesia: Lidocaine 1% without epinephrine without added sodium bicarbonate  Procedure Details  After adequate anesthesia, an elliptical incision was made, and no visible clot was extruded. Some blood was extruded. A hemorrhoid reduced in size significantly. This was well tolerated. Bulky dressing is applied.  Complications: none.  Plan: 1. Very frequent Sitz baths. 2. Colace  tid prn and increase fluids to prevent constipation.   3. Call or return to clinic prn if these symptoms worsen or fail to improve as anticipated

## 2011-06-28 ENCOUNTER — Encounter: Payer: Self-pay | Admitting: *Deleted

## 2012-04-05 ENCOUNTER — Ambulatory Visit (INDEPENDENT_AMBULATORY_CARE_PROVIDER_SITE_OTHER): Payer: BC Managed Care – PPO

## 2012-04-05 ENCOUNTER — Ambulatory Visit (INDEPENDENT_AMBULATORY_CARE_PROVIDER_SITE_OTHER): Payer: BC Managed Care – PPO | Admitting: Physician Assistant

## 2012-04-05 ENCOUNTER — Encounter: Payer: Self-pay | Admitting: Physician Assistant

## 2012-04-05 VITALS — BP 129/81 | HR 81 | Wt 166.0 lb

## 2012-04-05 DIAGNOSIS — R109 Unspecified abdominal pain: Secondary | ICD-10-CM

## 2012-04-05 DIAGNOSIS — R0781 Pleurodynia: Secondary | ICD-10-CM

## 2012-04-05 DIAGNOSIS — R350 Frequency of micturition: Secondary | ICD-10-CM

## 2012-04-05 DIAGNOSIS — R079 Chest pain, unspecified: Secondary | ICD-10-CM

## 2012-04-05 LAB — POCT URINALYSIS DIPSTICK
Blood, UA: NEGATIVE
Glucose, UA: NEGATIVE
Nitrite, UA: NEGATIVE
Spec Grav, UA: 1.01
Urobilinogen, UA: 0.2
pH, UA: 6.5

## 2012-04-05 MED ORDER — CYCLOBENZAPRINE HCL 5 MG PO TABS: 5.0000 mg | ORAL_TABLET | Freq: Two times a day (BID) | ORAL | Status: DC | PRN

## 2012-04-05 NOTE — Progress Notes (Signed)
  Subjective:    Patient ID: Melanie Glass, female    DOB: 1980/03/01, 32 y.o.   MRN: [id number]  HPI Patient is a 32 yo female who presents to the clinic with left lower rib and flank pain. Pain started on Monday morning she noticed her left rib and back area were sore and tender to touch. No rash. She couldn't wear her bra with underwire. Pain has not worsened but continued. Last night she tried to lay on her left side and felt pressure. Pain is worse with twisting or streching. She feels like she feels a lump off her left rib. No increased pain with cough or deep breathing. NO known trauma. She plays volleyball weekly and pain started before volleyball. She took NSAIDS and seems to help some. Denies any urinary pain or discomfort. She has had some increased frequency. She does drink a lot of water but going to bathroom at least 2 times in middle of night and 5-6 times during the day. Denies any abdominal pain, n/v/d, or fever, chills. Pt mentioned low WBC count at OB/GYN visit.      Review of Systems     Objective:   Physical Exam  Constitutional: She is oriented to person, place, and time. She appears well-developed and well-nourished.  HENT:  Head: Normocephalic and atraumatic.  Cardiovascular: Normal rate, regular rhythm and normal heart sounds.   Pulmonary/Chest: Effort normal and breath sounds normal.  No CVA tenderness.   Pain with palpation over 11th rib and a small nodule felt on exam about size of a marble that is firm but mobile and tender to touch.  Abdominal: Soft. Bowel sounds are normal. She exhibits no distension. There is no tenderness. There is no rebound and no guarding.  Neurological: She is alert and oriented to person, place, and time.  Skin: Skin is warm and dry.  NO brusing, swelling, or erythema of left ribs or flank.  Psychiatric: She has a normal mood and affect. Her behavior is normal.          Assessment & Plan:  Left flank pain/rib nodule- UA was  negative for blood, nitrates, leukocytes. Will send for xray of left rib. Will get CBC with diff. Discussed using ice and heat along with flexeril and NSAIDs. If not improving will consider U/S of left flank. Will order random glucose for frequent urination.

## 2012-04-06 LAB — CBC WITH DIFFERENTIAL/PLATELET
Basophils Relative: 0 % (ref 0–1)
Eosinophils Absolute: 0 10*3/uL (ref 0.0–0.7)
Eosinophils Relative: 1 % (ref 0–5)
Lymphs Abs: 1.8 10*3/uL (ref 0.7–4.0)
MCH: 32.1 pg (ref 26.0–34.0)
MCHC: 33.5 g/dL (ref 30.0–36.0)
MCV: 95.9 fL (ref 78.0–100.0)
Monocytes Relative: 7 % (ref 3–12)
Neutrophils Relative %: 46 % (ref 43–77)
Platelets: 182 10*3/uL (ref 150–400)
RBC: 3.86 MIL/uL — ABNORMAL LOW (ref 3.87–5.11)

## 2012-04-08 ENCOUNTER — Other Ambulatory Visit: Payer: Self-pay | Admitting: Physician Assistant

## 2012-04-08 DIAGNOSIS — R109 Unspecified abdominal pain: Secondary | ICD-10-CM

## 2012-04-08 DIAGNOSIS — R0781 Pleurodynia: Secondary | ICD-10-CM

## 2012-04-16 ENCOUNTER — Ambulatory Visit (INDEPENDENT_AMBULATORY_CARE_PROVIDER_SITE_OTHER): Payer: BC Managed Care – PPO

## 2012-04-16 DIAGNOSIS — R109 Unspecified abdominal pain: Secondary | ICD-10-CM

## 2012-04-16 DIAGNOSIS — K7689 Other specified diseases of liver: Secondary | ICD-10-CM

## 2012-04-16 DIAGNOSIS — R0781 Pleurodynia: Secondary | ICD-10-CM

## 2012-04-25 ENCOUNTER — Telehealth: Payer: Self-pay | Admitting: *Deleted

## 2012-04-25 NOTE — Telephone Encounter (Signed)
LMOM to return call. Kimberly Gordon, LPN  

## 2012-04-25 NOTE — Telephone Encounter (Signed)
Called pt back lvm for her to return call.Melanie Glass [place]

## 2012-10-31 ENCOUNTER — Ambulatory Visit (INDEPENDENT_AMBULATORY_CARE_PROVIDER_SITE_OTHER): Payer: BC Managed Care – PPO | Admitting: Family Medicine

## 2012-10-31 DIAGNOSIS — Z23 Encounter for immunization: Secondary | ICD-10-CM

## 2012-10-31 NOTE — Progress Notes (Signed)
  Subjective:    Patient ID: Melanie Glass, female    DOB: [birth year], 32 y.o.   MRN: [id number]  HPI    Review of Systems     Objective:   Physical Exam        Assessment & Plan:  Here for flu shot

## 2013-06-29 ENCOUNTER — Emergency Department (INDEPENDENT_AMBULATORY_CARE_PROVIDER_SITE_OTHER)
Admission: EM | Admit: 2013-06-29 | Discharge: 2013-06-29 | Disposition: A | Discharge status: Home / Self Care | Payer: BC Managed Care – PPO | Source: Home / Self Care | Attending: Family Medicine | Admitting: Family Medicine

## 2013-06-29 ENCOUNTER — Encounter: Payer: Self-pay | Admitting: Emergency Medicine

## 2013-06-29 DIAGNOSIS — H04201 Unspecified epiphora, right lacrimal gland: Secondary | ICD-10-CM

## 2013-06-29 DIAGNOSIS — H04209 Unspecified epiphora, unspecified lacrimal gland: Secondary | ICD-10-CM

## 2013-06-29 MED ORDER — OLOPATADINE HCL 0.2 % OP SOLN
OPHTHALMIC | Status: DC
Start: 2013-06-29 — End: 2013-11-17

## 2013-06-29 NOTE — Discharge Instructions (Signed)
May refrigerate eye drops.

## 2013-06-29 NOTE — ED Provider Notes (Signed)
CSN: [id number]     Arrival date & time 06/29/13  1138 History   First MD Initiated Contact with Patient 06/29/13 1214     Chief Complaint  Patient presents with  . Eye Drainage    R      HPI Comments: Patient complains of 5 day history of increased lacrimation in her right eye, most noticeable upon awakening each morning.  She denies eye drainage.  No foreign body sensation.  No photophobia.  No eye redness. She notes mild sinus congestion.  She has a history of seasonal allergies.                                                                                                                                                                                                                                                                  The history is provided by the patient.    History reviewed. No pertinent past medical history. History reviewed. No pertinent past surgical history. Family History  Problem Relation Age of Onset  . Mental illness Mother   . Hypertension Mother   . Hypertension Father    History  Substance Use Topics  . Smoking status: Never Smoker   . Smokeless tobacco: Not on file  . Alcohol Use: Yes   OB History   Grav Para Term Preterm Abortions TAB SAB Ect Mult Living                 Review of Systems No sore throat No cough No pleuritic pain No wheezing + nasal congestion No post-nasal drainage No sinus pain/pressure No itchy/red eyes, but has increased lacrimation right eye. No change in vision No earache No hemoptysis No SOB No fever/chills No nausea No vomiting No abdominal pain No diarrhea No urinary symptoms No skin rash aa fatigue No myalgias No headache Used OTC meds without relief  Allergies  Review of patient's allergies indicates no known allergies.  Home Medications   Prior to Admission medications   Medication Sig Start Date End Date Taking? Authorizing Provider  cetirizine (ZYRTEC) 10 MG tablet Take 10 mg by mouth daily.    Yes Historical Provider, MD  cyclobenzaprine (FLEXERIL) 5 MG tablet Take 1 tablet (5 mg total) by mouth 2 (two) times daily as needed  for muscle spasms. 04/05/12   Jade L Breeback, PA-C  Norgestimate-Eth Estradiol (SPRINTEC 28 PO) Take by mouth.    Historical Provider, MD  Olopatadine HCl 0.2 % SOLN Place one drop in affected eye once daily for allergic symptoms 06/29/13   Kandra Nicolas, MD   BP 139/88  Pulse 84  Temp(Src) 98.2 F (36.8 C) (Oral)  Ht 5\' 5"  (1.651 m)  Wt 168 lb (76.204 kg)  BMI 27.96 kg/m2  SpO2 100% Physical Exam Nursing notes and Vital Signs reviewed. Appearance:  Patient appears healthy, stated age, and in no acute distress Eyes:  Pupils are equal, round, and reactive to light and accomodation.  Extraocular movement is intact.  Conjunctivae are not inflamed.  No lid swelling or tenderness.  No discharge present.  No photophobia.                                     Ears:  Canals normal.  Tympanic membranes normal.  Nose:  Mildly congested turbinates.  No sinus tenderness.    Pharynx:  Normal Neck:  Supple.  No adenopathy Skin:  No rash present.   ED Course  Procedures  none      MDM   1. Lacrimation, right; suspect allergy etiology                                                                                                                                                                                  Begin Pataday eye solution. May refrigerate eye drops for comfort. Followup with ophthalmologist if not improved one week    Kandra Nicolas, MD 06/29/13 1745

## 2013-06-29 NOTE — ED Notes (Signed)
Pt complains of  Runny R eye for 5 days.  Denies itching, redness, burning, vision changes.  Says sometimes she has a thick mucous accumulate in corner of her eye.

## 2013-10-17 ENCOUNTER — Emergency Department (INDEPENDENT_AMBULATORY_CARE_PROVIDER_SITE_OTHER)
Admission: EM | Admit: 2013-10-17 | Discharge: 2013-10-17 | Disposition: A | Discharge status: Home / Self Care | Payer: BC Managed Care – PPO | Source: Home / Self Care

## 2013-10-17 ENCOUNTER — Encounter: Payer: Self-pay | Admitting: Emergency Medicine

## 2013-10-17 DIAGNOSIS — Z23 Encounter for immunization: Secondary | ICD-10-CM

## 2013-10-17 MED ORDER — INFLUENZA VAC SPLIT QUAD 0.5 ML IM SUSY
0.5000 mL | PREFILLED_SYRINGE | Freq: Once | INTRAMUSCULAR | Status: AC
  Administered 2013-10-17: 0.5 mL via INTRAMUSCULAR

## 2013-10-17 NOTE — ED Notes (Signed)
Here for flu vaccine only

## 2013-10-30 ENCOUNTER — Ambulatory Visit: Payer: BC Managed Care – PPO | Admitting: Family Medicine

## 2013-11-17 ENCOUNTER — Ambulatory Visit (INDEPENDENT_AMBULATORY_CARE_PROVIDER_SITE_OTHER): Payer: BC Managed Care – PPO | Admitting: Physician Assistant

## 2013-11-17 ENCOUNTER — Encounter: Payer: Self-pay | Admitting: Physician Assistant

## 2013-11-17 VITALS — BP 105/66 | HR 70 | Ht 65.0 in | Wt 173.0 lb

## 2013-11-17 DIAGNOSIS — Z23 Encounter for immunization: Secondary | ICD-10-CM | POA: Diagnosis not present

## 2013-11-17 DIAGNOSIS — K219 Gastro-esophageal reflux disease without esophagitis: Secondary | ICD-10-CM | POA: Diagnosis not present

## 2013-11-17 MED ORDER — PANTOPRAZOLE SODIUM 40 MG PO TBEC: 40.0000 mg | DELAYED_RELEASE_TABLET | Freq: Every day | ORAL | Status: DC

## 2013-11-17 NOTE — Patient Instructions (Signed)

## 2013-11-17 NOTE — Progress Notes (Signed)
   Subjective:    Patient ID: Melanie Glass, female    DOB: 04-Apr-1980, 33 y.o.   MRN: [id number]  HPI  Pt is a 33 yo female who presents to the clinic with GERD-like symptoms. She has a lot of gas and bloating after eating. She often feels abdominal chest pressure when laying down after meal. Sitting up tends to resolve symptoms. She has tried over-the-counter Prilosec which helps a lot. She notices more symptoms after eating spicy or acidic foods. She also noticed some intolerance after drinking milk. She denies any ongoing abdominal pain. Her bowel movements are sometimes loose but regular. She denies any blood in her stool.   Review of Systems  All other systems reviewed and are negative.      Objective:   Physical Exam  Constitutional: She is oriented to person, place, and time. She appears well-developed and well-nourished.  HENT:  Head: Normocephalic and atraumatic.  Cardiovascular: Normal rate, regular rhythm and normal heart sounds.   Pulmonary/Chest: Effort normal and breath sounds normal. She has no wheezes.  Abdominal: Soft. Bowel sounds are normal. She exhibits no distension and no mass. There is no tenderness. There is no rebound and no guarding.  Neurological: She is alert and oriented to person, place, and time.  Skin: Skin is dry.  Psychiatric: She has a normal mood and affect. Her behavior is normal.          Assessment & Plan:  GERD-will try Protonix 40 mg in the morning before breakfast. Discussed after the next 2 months she can decrease to every other day if she feels like symptoms are controlled. Patient was given a copy of a GERD diet that can certainly help symptoms. There could also be a intolerance to dairy. Consider watching her diet for GI symptoms after dairy products and changing diet as tolerated. Follow up if symptoms not improving or if developing abdominal pain. Certainly if symptoms not improving with PPI or continue consider EGD.

## 2014-03-23 ENCOUNTER — Other Ambulatory Visit: Payer: Self-pay | Admitting: Physician Assistant

## 2014-06-05 ENCOUNTER — Ambulatory Visit (INDEPENDENT_AMBULATORY_CARE_PROVIDER_SITE_OTHER): Payer: BLUE CROSS/BLUE SHIELD | Admitting: Family Medicine

## 2014-06-05 ENCOUNTER — Encounter: Payer: Self-pay | Admitting: Family Medicine

## 2014-06-05 VITALS — BP 114/73 | HR 64 | Ht 65.0 in | Wt 171.0 lb

## 2014-06-05 DIAGNOSIS — N644 Mastodynia: Secondary | ICD-10-CM

## 2014-06-05 DIAGNOSIS — K219 Gastro-esophageal reflux disease without esophagitis: Secondary | ICD-10-CM | POA: Diagnosis not present

## 2014-06-05 DIAGNOSIS — R140 Abdominal distension (gaseous): Secondary | ICD-10-CM

## 2014-06-05 DIAGNOSIS — Z13220 Encounter for screening for lipoid disorders: Secondary | ICD-10-CM

## 2014-06-05 DIAGNOSIS — Z114 Encounter for screening for human immunodeficiency virus [HIV]: Secondary | ICD-10-CM

## 2014-06-05 DIAGNOSIS — Z111 Encounter for screening for respiratory tuberculosis: Secondary | ICD-10-CM | POA: Diagnosis not present

## 2014-06-05 LAB — COMPLETE METABOLIC PANEL WITH GFR
ALT: 20 U/L (ref 0–35)
AST: 21 U/L (ref 0–37)
Albumin: 4.2 g/dL (ref 3.5–5.2)
Alkaline Phosphatase: 37 U/L — ABNORMAL LOW (ref 39–117)
BUN: 14 mg/dL (ref 6–23)
CALCIUM: 9.1 mg/dL (ref 8.4–10.5)
CO2: 24 mEq/L (ref 19–32)
CREATININE: 0.73 mg/dL (ref 0.50–1.10)
Chloride: 103 mEq/L (ref 96–112)
GFR, Est African American: >89 mL/min
GFR, Est Non African American: >89 mL/min
Glucose, Bld: 82 mg/dL (ref 70–99)
Potassium: 4 mEq/L (ref 3.5–5.3)
Sodium: 138 mEq/L (ref 135–145)
Total Bilirubin: 0.5 mg/dL (ref 0.2–1.2)
Total Protein: 6.8 g/dL (ref 6.0–8.3)

## 2014-06-05 LAB — LIPID PANEL
CHOL/HDL RATIO: 2.4 ratio
CHOLESTEROL: 229 mg/dL — AB (ref 0–200)
HDL: 95 mg/dL (ref >=46)
LDL CALC: 127 mg/dL — AB (ref 0–99)
TRIGLYCERIDES: 34 mg/dL (ref <150)
VLDL: 7 mg/dL (ref 0–40)

## 2014-06-05 MED ORDER — PANTOPRAZOLE SODIUM 40 MG PO TBEC: DELAYED_RELEASE_TABLET | ORAL | Status: DC

## 2014-06-05 NOTE — Progress Notes (Signed)
   Subjective:    Patient ID: Melanie Glass, female    DOB: 17-Feb-1980, 34 y.o.   MRN: [id number]  HPI Here today to discuss reflux. She is still taking the Protonix. It works well to control her symptoms. She did try stopping it for about 2 weeks but towards the end of the 2 weeks she started to notice more symptoms so she restarted the medication. She does get some occasional bloating and occasional abdominal pain but not frequently. She wonders if she might have IBS at times. She also notices a lot of gas. She wonders if she could be intolerant to something. Her son does have lactose intolerance.   She also notes that she has some right breast pain at the end of November. She says it was a sharp pain towards the axilla and lateral part of the breast. She said she felt several fatty nodules in the breast tissue at that time. It actually resolved after about 2 weeks when she started her menstrual cycle. No known trigers.  No worsening factors.  She's had to normal. Since then without any breast pain or recurrence of pain. She did not notice any redness or swelling around the time that she had the pain.  Review of Systems     Objective:   Physical Exam  Constitutional: She is oriented to person, place, and time. She appears well-developed and well-nourished.  HENT:  Head: Normocephalic and atraumatic.  Cardiovascular: Normal rate, regular rhythm and normal heart sounds.   Pulmonary/Chest: Effort normal and breath sounds normal.  Neurological: She is alert and oriented to person, place, and time.  Skin: Skin is warm and dry.  Psychiatric: She has a normal mood and affect. Her behavior is normal.          Assessment & Plan:  GERD-discussed the long-term risks of continued PPI therapy. We discussed trying to wean down as she is able and maybe even stepping down to a prescription strength H2 blocker such as ranitidine 150 mg. We also reviewed dietary measures. Test for milk allergy, lactose  intolerance and gluten intolerance.  She's due for cholesterol screening. Last one was done in 2011.  Right breast pain 4 months ago-seems to have resolved on its own. We discussed that certainly it could've been somewhat hormonal since the pain resolved when she finally started her period or she could've had some mild lymphadenopathy of the time from an injury or cut to the skin or infection. Certainly if it happens again or she notices any lumps encouraged her to come back and we can do a mammogram sooner rather than later.  TB skin test placed today.

## 2014-06-05 NOTE — Addendum Note (Signed)
Addended by: Darla Lesches T on: 06/05/2014 09:08 AM   Modules accepted: Orders

## 2014-06-06 LAB — HIV ANTIBODY (ROUTINE TESTING W REFLEX): HIV 1&2 Ab, 4th Generation: NONREACTIVE

## 2014-06-08 LAB — GLIA (IGA/G) + TTG IGA
GLIADIN IGA: 2 U (ref <20)
GLIADIN IGG: 6 U (ref <20)
Tissue Transglutaminase Ab, IgA: 1 U/mL (ref <4)

## 2014-06-09 ENCOUNTER — Other Ambulatory Visit: Payer: Self-pay | Admitting: *Deleted

## 2014-06-09 DIAGNOSIS — R140 Abdominal distension (gaseous): Secondary | ICD-10-CM

## 2014-06-09 DIAGNOSIS — K219 Gastro-esophageal reflux disease without esophagitis: Secondary | ICD-10-CM

## 2014-06-09 LAB — LACTOSE TOLERANCE TEST

## 2014-06-10 LAB — ALLERGEN MILK: Milk IgE: <0.1 kU/L

## 2014-10-19 ENCOUNTER — Ambulatory Visit (INDEPENDENT_AMBULATORY_CARE_PROVIDER_SITE_OTHER): Payer: BLUE CROSS/BLUE SHIELD | Admitting: Family Medicine

## 2014-10-19 VITALS — Temp 99.0°F

## 2014-10-19 DIAGNOSIS — Z23 Encounter for immunization: Secondary | ICD-10-CM

## 2015-03-19 ENCOUNTER — Ambulatory Visit (INDEPENDENT_AMBULATORY_CARE_PROVIDER_SITE_OTHER): Payer: BLUE CROSS/BLUE SHIELD | Admitting: Family Medicine

## 2015-03-19 ENCOUNTER — Encounter: Payer: Self-pay | Admitting: Family Medicine

## 2015-03-19 VITALS — BP 116/79 | HR 85 | Temp 99.3°F | Wt 176.0 lb

## 2015-03-19 DIAGNOSIS — R300 Dysuria: Secondary | ICD-10-CM

## 2015-03-19 LAB — POCT URINALYSIS DIPSTICK
Bilirubin, UA: NEGATIVE
Blood, UA: NEGATIVE
Glucose, UA: NEGATIVE
Ketones, UA: NEGATIVE
LEUKOCYTES UA: NEGATIVE
NITRITE UA: NEGATIVE
PROTEIN UA: NEGATIVE
UROBILINOGEN UA: 1
pH, UA: 6

## 2015-03-19 MED ORDER — SULFAMETHOXAZOLE-TRIMETHOPRIM 800-160 MG PO TABS: 1.0000 | ORAL_TABLET | Freq: Two times a day (BID) | ORAL | Status: DC

## 2015-03-19 NOTE — Progress Notes (Signed)
CC: Melanie Glass is a 35 y.o. female is here for UTI Sx   Subjective: HPI:  3 days of cloudy urine, dysuria, urinary urgency but has not been getting better or worse since onset. Symptoms are mild in severity. She's never had this before. She denies any vaginal discharge or blood in her urine. Denies fevers, chills, abdominal pain or flank pain. No nausea. No interventions as of yet   Review Of Systems Outlined In HPI  No past medical history on file.  No past surgical history on file. Family History  Problem Relation Age of Onset  . Mental illness Mother   . Hypertension Mother   . Hypertension Father     Social History   Social History  . Marital Status: Married    Spouse Name: N/A  . Number of Children: N/A  . Years of Education: N/A   Occupational History  . Not on file.   Social History Main Topics  . Smoking status: Never Smoker   . Smokeless tobacco: Not on file  . Alcohol Use: Yes  . Drug Use: No  . Sexual Activity: Not on file   Other Topics Concern  . Not on file   Social History Narrative     Objective: BP 116/79 mmHg  Pulse 85  Temp(Src) 99.3 F (37.4 C) (Oral)  Wt 176 lb (79.833 kg)  SpO2 99%  Vital signs reviewed. General: Alert and Oriented, No Acute Distress HEENT: Pupils equal, round, reactive to light. Conjunctivae clear.  External ears unremarkable.  Moist mucous membranes. Lungs: Clear and comfortable work of breathing, speaking in full sentences without accessory muscle use. Cardiac: Regular rate and rhythm.  Neuro: CN II-XII grossly intact, gait normal. Extremities: No peripheral edema.  Strong peripheral pulses.  Mental Status: No depression, anxiety, nor agitation. Logical though process. Skin: Warm and dry.  Assessment & Plan: Melanie Glass was seen today for uti sx.  Diagnoses and all orders for this visit:  Dysuria -     Urine culture -     POCT urinalysis dipstick  Other orders -     sulfamethoxazole-trimethoprim (BACTRIM  DS,SEPTRA DS) 800-160 MG tablet; Take 1 tablet by mouth 2 (two) times daily.   Symptoms suspicious for UTI therefore start Bactrim pending urine culture over the weekend.   Return if symptoms worsen or fail to improve.

## 2015-03-20 LAB — URINE CULTURE: Colony Count: >=100000

## 2015-11-02 ENCOUNTER — Encounter: Payer: Self-pay | Admitting: Family Medicine

## 2015-11-02 ENCOUNTER — Ambulatory Visit (INDEPENDENT_AMBULATORY_CARE_PROVIDER_SITE_OTHER): Payer: BLUE CROSS/BLUE SHIELD | Admitting: Family Medicine

## 2015-11-02 VITALS — BP 125/74 | HR 73 | Ht 65.0 in | Wt 174.0 lb

## 2015-11-02 DIAGNOSIS — Z23 Encounter for immunization: Secondary | ICD-10-CM

## 2015-11-02 DIAGNOSIS — Z0000 Encounter for general adult medical examination without abnormal findings: Secondary | ICD-10-CM | POA: Diagnosis not present

## 2015-11-02 LAB — COMPLETE METABOLIC PANEL WITH GFR
ALT: 13 U/L (ref 6–29)
AST: 17 U/L (ref 10–30)
Albumin: 4.1 g/dL (ref 3.6–5.1)
Alkaline Phosphatase: 35 U/L (ref 33–115)
BUN: 11 mg/dL (ref 7–25)
CHLORIDE: 105 mmol/L (ref 98–110)
CO2: 27 mmol/L (ref 20–31)
CREATININE: 0.89 mg/dL (ref 0.50–1.10)
Calcium: 9.1 mg/dL (ref 8.6–10.2)
GFR, Est African American: >89 mL/min (ref >=60)
GFR, Est Non African American: 84 mL/min (ref >=60)
Glucose, Bld: 86 mg/dL (ref 65–99)
Potassium: 4.3 mmol/L (ref 3.5–5.3)
SODIUM: 140 mmol/L (ref 135–146)
Total Bilirubin: 0.4 mg/dL (ref 0.2–1.2)
Total Protein: 6.6 g/dL (ref 6.1–8.1)

## 2015-11-02 LAB — CBC
HEMATOCRIT: 39.3 % (ref 35.0–45.0)
HEMOGLOBIN: 12.4 g/dL (ref 11.7–15.5)
MCH: 31.4 pg (ref 27.0–33.0)
MCHC: 31.6 g/dL — AB (ref 32.0–36.0)
MCV: 99.5 fL (ref 80.0–100.0)
MPV: 10.4 fL (ref 7.5–12.5)
Platelets: 232 10*3/uL (ref 140–400)
RBC: 3.95 MIL/uL (ref 3.80–5.10)
RDW: 13.6 % (ref 11.0–15.0)
WBC: 2.8 10*3/uL — ABNORMAL LOW (ref 3.8–10.8)

## 2015-11-02 LAB — LIPID PANEL
CHOLESTEROL: 204 mg/dL — AB (ref 125–200)
HDL: 90 mg/dL (ref >=46)
LDL CALC: 108 mg/dL (ref <130)
Total CHOL/HDL Ratio: 2.3 Ratio (ref <=5.0)
Triglycerides: 32 mg/dL (ref <150)
VLDL: 6 mg/dL (ref <30)

## 2015-11-02 LAB — TSH: TSH: 0.86 mIU/L

## 2015-11-02 NOTE — Patient Instructions (Signed)
Keep up a regular exercise program and make sure you are eating a healthy diet Try to eat 4 servings of dairy a day, or if you are lactose intolerant take a calcium with vitamin D daily.  Your vaccines are up to date.   

## 2015-11-02 NOTE — Progress Notes (Signed)
   Subjective:     Melanie Glass is a 35 y.o. female and is here for a comprehensive physical exam. The patient reports no problems.  Social History   Social History  . Marital status: Married    Spouse name: N/A  . Number of children: N/A  . Years of education: N/A   Occupational History  . Not on file.   Social History Main Topics  . Smoking status: Never Smoker  . Smokeless tobacco: Not on file  . Alcohol use Yes  . Drug use: No  . Sexual activity: Not on file   Other Topics Concern  . Not on file   Social History Narrative  . No narrative on file   Health Maintenance  Topic Date Due  . PAP SMEAR  04/02/2016  . TETANUS/TDAP  11/18/2023  . INFLUENZA VACCINE  Addressed  . HIV Screening  Completed    The following portions of the patient's history were reviewed and updated as appropriate: allergies, current medications, past family history, past medical history, past social history, past surgical history and problem list.  Review of Systems A comprehensive review of systems was negative.   Objective:    BP 125/74   Pulse 73   Ht 5\' 5"  (1.651 m)   Wt 174 lb (78.9 kg)   LMP 10/21/2015 (Approximate)   SpO2 100%   BMI 28.96 kg/m  General appearance: alert, cooperative and appears stated age Head: Normocephalic, without obvious abnormality, atraumatic Eyes: conj clear, EOMI, PEERLA Ears: normal TM's and external ear canals both ears Nose: Nares normal. Septum midline. Mucosa normal. No drainage or sinus tenderness. Throat: lips, mucosa, and tongue normal; teeth and gums normal Neck: no adenopathy, no carotid bruit, no JVD, supple, symmetrical, trachea midline and thyroid not enlarged, symmetric, no tenderness/mass/nodules Back: symmetric, no curvature. ROM normal. No CVA tenderness. Lungs: clear to auscultation bilaterally Heart: regular rate and rhythm, S1, S2 normal, no murmur, click, rub or gallop Abdomen: soft, non-tender; bowel sounds normal; no masses,  no  organomegaly Extremities: extremities normal, atraumatic, no cyanosis or edema Pulses: 2+ and symmetric Skin: Skin color, texture, turgor normal. No rashes or lesions Lymph nodes: Cervical, supraclavicular, and axillary nodes normal. Neurologic: Alert and oriented X 3, normal strength and tone. Normal symmetric reflexes. Normal coordination and gait    Assessment:    Healthy female exam.      Plan:     See After Visit Summary for Counseling Recommendations   Keep up a regular exercise program and make sure you are eating a healthy diet Try to eat 4 servings of dairy a day, or if you are lactose intolerant take a calcium with vitamin D daily.  Your vaccines are up to date.

## 2015-11-04 ENCOUNTER — Other Ambulatory Visit: Payer: Self-pay

## 2015-11-04 DIAGNOSIS — D72818 Other decreased white blood cell count: Secondary | ICD-10-CM

## 2015-11-16 ENCOUNTER — Other Ambulatory Visit: Payer: Self-pay

## 2015-11-16 DIAGNOSIS — D72818 Other decreased white blood cell count: Secondary | ICD-10-CM

## 2015-11-17 LAB — CBC WITH DIFFERENTIAL/PLATELET
BASOS PCT: 0 %
Basophils Absolute: 0 cells/uL (ref 0–200)
Eosinophils Absolute: 42 cells/uL (ref 15–500)
Eosinophils Relative: 1 %
HEMATOCRIT: 36.5 % (ref 35.0–45.0)
HEMOGLOBIN: 11.9 g/dL (ref 11.7–15.5)
LYMPHS ABS: 756 {cells}/uL — AB (ref 850–3900)
Lymphocytes Relative: 18 %
MCH: 31.7 pg (ref 27.0–33.0)
MCHC: 32.6 g/dL (ref 32.0–36.0)
MCV: 97.3 fL (ref 80.0–100.0)
MONO ABS: 378 {cells}/uL (ref 200–950)
MPV: 10.7 fL (ref 7.5–12.5)
Monocytes Relative: 9 %
NEUTROS PCT: 72 %
Neutro Abs: 3024 cells/uL (ref 1500–7800)
Platelets: 190 10*3/uL (ref 140–400)
RBC: 3.75 MIL/uL — AB (ref 3.80–5.10)
RDW: 13.6 % (ref 11.0–15.0)
WBC: 4.2 10*3/uL (ref 3.8–10.8)

## 2015-11-23 ENCOUNTER — Encounter: Payer: Self-pay | Admitting: Family Medicine

## 2015-11-23 ENCOUNTER — Other Ambulatory Visit: Payer: Self-pay | Admitting: Family Medicine

## 2015-11-23 MED ORDER — AMOXICILLIN-POT CLAVULANATE 875-125 MG PO TABS: 1.0000 | ORAL_TABLET | Freq: Two times a day (BID) | ORAL | 0 refills | Status: DC

## 2016-01-25 ENCOUNTER — Ambulatory Visit (INDEPENDENT_AMBULATORY_CARE_PROVIDER_SITE_OTHER): Payer: BLUE CROSS/BLUE SHIELD | Admitting: Family Medicine

## 2016-01-25 VITALS — BP 121/82 | HR 70 | Temp 98.1°F

## 2016-01-25 DIAGNOSIS — Z23 Encounter for immunization: Secondary | ICD-10-CM

## 2016-01-25 DIAGNOSIS — Z111 Encounter for screening for respiratory tuberculosis: Secondary | ICD-10-CM | POA: Diagnosis not present

## 2016-01-25 NOTE — Progress Notes (Signed)
Agree with above.  Catherine Metheney, MD  

## 2016-01-25 NOTE — Progress Notes (Signed)
Patient came into clinic today for PPD placement. Pt states this is required yearly for work, she works at a [employer]. Pt has never had a positive PPD test. Pt tolerated PPD placement in right forearm well, no immediate complications. Pt plans to have a nurse at work read the PPD test. Placement form (including manufacturer, lot #, expiration date, etc) completed for Pt, she will have reading nurse fax results to office so we can update her chart. No further questions or concerns at this time.

## 2016-06-30 ENCOUNTER — Encounter: Payer: Self-pay | Admitting: Family Medicine

## 2016-06-30 ENCOUNTER — Ambulatory Visit (INDEPENDENT_AMBULATORY_CARE_PROVIDER_SITE_OTHER): Payer: BLUE CROSS/BLUE SHIELD | Admitting: Family Medicine

## 2016-06-30 VITALS — BP 125/85 | HR 89 | Ht 65.0 in | Wt 181.0 lb

## 2016-06-30 DIAGNOSIS — R1032 Left lower quadrant pain: Secondary | ICD-10-CM

## 2016-06-30 DIAGNOSIS — K219 Gastro-esophageal reflux disease without esophagitis: Secondary | ICD-10-CM

## 2016-06-30 DIAGNOSIS — R350 Frequency of micturition: Secondary | ICD-10-CM | POA: Diagnosis not present

## 2016-06-30 DIAGNOSIS — R195 Other fecal abnormalities: Secondary | ICD-10-CM

## 2016-06-30 DIAGNOSIS — R197 Diarrhea, unspecified: Secondary | ICD-10-CM | POA: Diagnosis not present

## 2016-06-30 DIAGNOSIS — N920 Excessive and frequent menstruation with regular cycle: Secondary | ICD-10-CM

## 2016-06-30 LAB — POCT URINALYSIS DIPSTICK
BILIRUBIN UA: NEGATIVE
Blood, UA: NEGATIVE
Glucose, UA: NEGATIVE
KETONES UA: NEGATIVE
Leukocytes, UA: NEGATIVE
Nitrite, UA: NEGATIVE
PROTEIN UA: NEGATIVE
Urobilinogen, UA: 0.2 E.U./dL
pH, UA: 6.5 (ref 5.0–8.0)

## 2016-06-30 NOTE — Progress Notes (Signed)
Subjective:    Patient ID: Melanie Glass, female    DOB: 1980-10-14, 36 y.o.   MRN: [id number]  HPI 36 year old female here today with some more recent GI issues. On June 18 she woke up in the middle of the night. She thought she was starting her menstrual cycle. She did but started to have some more severe cramps. More than her usual menstrual cramping. She said it was bad enough that she actually on her bathroom floor for a while. She felt very nauseated but did not vomit. She took some Aleve and that did help some. Over the next couple of days she started to experience more epigastric pain and pressure. She does have a history of reflux and took some Tums for about 3 days and that did seem to help. She's had some intermittent diarrhea and mucus in her stools since then. No blood in the stool. She did get back from a trip to [place] right before her symptoms actually started. She denies any fevers chills or sweats.  She also reports that she has heavy menstrual periods and passes clots. She does tend to get a fair amount of cramping. Though again this last time was more than usual.  She is concerned about the possibilty of endometriosis. She often gets LLQ when she has her menstrual cycle.     Review of Systems   BP 125/85   Pulse 89   Ht 5\' 5"  (1.651 m)   Wt 181 lb (82.1 kg)   BMI 30.12 kg/m     No Known Allergies  No past medical history on file.  No past surgical history on file.  Social History   Social History  . Marital status: Married    Spouse name: N/A  . Number of children: N/A  . Years of education: N/A   Occupational History  . Not on file.   Social History Main Topics  . Smoking status: Never Smoker  . Smokeless tobacco: Never Used  . Alcohol use Yes  . Drug use: No  . Sexual activity: Not on file   Other Topics Concern  . Not on file   Social History Narrative  . No narrative on file    Family History  Problem Relation Age of Onset  . Mental illness  Mother   . Hypertension Mother   . Hypertension Father     Outpatient Encounter Prescriptions as of 06/30/2016  Medication Sig  . cetirizine (ZYRTEC) 10 MG tablet Take 10 mg by mouth as needed.    No facility-administered encounter medications on file as of 06/30/2016.           Objective:   Physical Exam  Constitutional: She is oriented to person, place, and time. She appears well-developed and well-nourished.  HENT:  Head: Normocephalic and atraumatic.  Cardiovascular: Normal rate, regular rhythm and normal heart sounds.   Pulmonary/Chest: Effort normal and breath sounds normal.  Abdominal: Soft. Bowel sounds are normal. She exhibits no distension and no mass. There is no tenderness. There is no rebound and no guarding.  Neurological: She is alert and oriented to person, place, and time.  Skin: Skin is warm and dry.  Psychiatric: She has a normal mood and affect. Her behavior is normal.        Assessment & Plan:  Diarrhea/mucous stools x 5 days - will get stool culture and CBC.  Could be viral or bacterial or a food sensitivity.    Heavy periods - check pelvic US to  eval for fibroids or ovarian cyst, etc.  Consider endometriosis.   Epigastric pain/GERD - consider restart PPI if sxs persist.  Ok for TUMS prn.

## 2016-07-03 LAB — CBC WITH DIFFERENTIAL/PLATELET
BASOS PCT: 1 %
Basophils Absolute: 31 cells/uL (ref 0–200)
EOS PCT: 2 %
Eosinophils Absolute: 62 cells/uL (ref 15–500)
HCT: 38.1 % (ref 35.0–45.0)
Hemoglobin: 12.2 g/dL (ref 11.7–15.5)
LYMPHS ABS: 1519 {cells}/uL (ref 850–3900)
LYMPHS PCT: 49 %
MCH: 31.6 pg (ref 27.0–33.0)
MCHC: 32 g/dL (ref 32.0–36.0)
MCV: 98.7 fL (ref 80.0–100.0)
MONO ABS: 372 {cells}/uL (ref 200–950)
MPV: 10.3 fL (ref 7.5–12.5)
Monocytes Relative: 12 %
Neutro Abs: 1116 cells/uL — ABNORMAL LOW (ref 1500–7800)
Neutrophils Relative %: 36 %
Platelets: 221 10*3/uL (ref 140–400)
RBC: 3.86 MIL/uL (ref 3.80–5.10)
RDW: 14.3 % (ref 11.0–15.0)
WBC: 3.1 10*3/uL — AB (ref 3.8–10.8)

## 2016-07-04 LAB — COMPLETE METABOLIC PANEL WITH GFR
ALT: 13 U/L (ref 6–29)
AST: 16 U/L (ref 10–30)
Albumin: 4 g/dL (ref 3.6–5.1)
Alkaline Phosphatase: 38 U/L (ref 33–115)
BUN: 11 mg/dL (ref 7–25)
CALCIUM: 8.9 mg/dL (ref 8.6–10.2)
CHLORIDE: 104 mmol/L (ref 98–110)
CO2: 27 mmol/L (ref 20–31)
CREATININE: 0.93 mg/dL (ref 0.50–1.10)
GFR, Est African American: >89 mL/min (ref >=60)
GFR, Est Non African American: 79 mL/min (ref >=60)
GLUCOSE: 83 mg/dL (ref 65–99)
POTASSIUM: 4 mmol/L (ref 3.5–5.3)
SODIUM: 138 mmol/L (ref 135–146)
Total Bilirubin: 0.4 mg/dL (ref 0.2–1.2)
Total Protein: 6.5 g/dL (ref 6.1–8.1)

## 2016-07-08 LAB — STOOL CULTURE

## 2016-07-19 ENCOUNTER — Ambulatory Visit (INDEPENDENT_AMBULATORY_CARE_PROVIDER_SITE_OTHER): Payer: BLUE CROSS/BLUE SHIELD

## 2016-07-19 DIAGNOSIS — D259 Leiomyoma of uterus, unspecified: Secondary | ICD-10-CM

## 2016-07-19 DIAGNOSIS — R1032 Left lower quadrant pain: Secondary | ICD-10-CM

## 2016-07-19 DIAGNOSIS — N920 Excessive and frequent menstruation with regular cycle: Secondary | ICD-10-CM

## 2016-07-20 ENCOUNTER — Other Ambulatory Visit: Payer: Self-pay | Admitting: Family Medicine

## 2016-07-20 MED ORDER — NORGESTIM-ETH ESTRAD TRIPHASIC 0.18/0.215/0.25 MG-35 MCG PO TABS: 1.0000 | ORAL_TABLET | Freq: Every day | ORAL | 11 refills | Status: DC

## 2016-07-20 NOTE — Progress Notes (Signed)
OK, new rx sent.  

## 2016-08-20 ENCOUNTER — Encounter: Payer: Self-pay | Admitting: Family Medicine

## 2016-08-21 MED ORDER — NORGESTIM-ETH ESTRAD TRIPHASIC 0.18/0.215/0.25 MG-35 MCG PO TABS: 1.0000 | ORAL_TABLET | Freq: Every day | ORAL | 4 refills | Status: DC

## 2016-11-11 ENCOUNTER — Encounter: Payer: Self-pay | Admitting: Emergency Medicine

## 2016-11-11 ENCOUNTER — Emergency Department (INDEPENDENT_AMBULATORY_CARE_PROVIDER_SITE_OTHER)
Admission: EM | Admit: 2016-11-11 | Discharge: 2016-11-11 | Disposition: A | Discharge status: Home / Self Care | Payer: BLUE CROSS/BLUE SHIELD | Source: Home / Self Care

## 2016-11-11 DIAGNOSIS — Z23 Encounter for immunization: Secondary | ICD-10-CM | POA: Diagnosis not present

## 2016-11-11 HISTORY — DX: Other allergic rhinitis: J30.89

## 2016-11-11 MED ORDER — INFLUENZA VAC SPLIT QUAD 0.5 ML IM SUSY
0.5000 mL | PREFILLED_SYRINGE | Freq: Once | INTRAMUSCULAR | Status: AC
  Administered 2016-11-11: 0.5 mL via INTRAMUSCULAR

## 2016-11-11 NOTE — ED Triage Notes (Signed)
Desires flu shot. 

## 2017-01-24 ENCOUNTER — Ambulatory Visit (INDEPENDENT_AMBULATORY_CARE_PROVIDER_SITE_OTHER): Payer: BLUE CROSS/BLUE SHIELD | Admitting: Family Medicine

## 2017-01-24 VITALS — BP 134/87 | HR 72 | Temp 98.8°F | Resp 16 | Wt 186.3 lb

## 2017-01-24 DIAGNOSIS — Z111 Encounter for screening for respiratory tuberculosis: Secondary | ICD-10-CM

## 2017-01-24 NOTE — Progress Notes (Signed)
HPI: Patient is here to have for PPD placement. Patient denies ever testing positive to PPD test or having to have a chest xray done.   Assessment and Plan: Placed PPD test to patient's left forearm. Patient tolerated injection well with out any complications.   Patient advised to schedule nurse visit to have PPD read in 48-72 hours. Patient advised PPD can be read 01/26/17 after 2 pm; if not read before 5 pm when our office closes, she will have to go to out Urgent Care to have PPD read before 2 pm.   Patient stated she understood and would schedule nurse visit appt for Friday, 01/26/17.

## 2017-01-25 NOTE — Progress Notes (Signed)
Agree with documentation as above.   Geralene Afshar, MD  

## 2017-01-26 ENCOUNTER — Ambulatory Visit (INDEPENDENT_AMBULATORY_CARE_PROVIDER_SITE_OTHER): Payer: BLUE CROSS/BLUE SHIELD | Admitting: Family Medicine

## 2017-01-26 VITALS — BP 134/87 | HR 68

## 2017-01-26 DIAGNOSIS — Z111 Encounter for screening for respiratory tuberculosis: Secondary | ICD-10-CM | POA: Diagnosis not present

## 2017-01-26 LAB — TB SKIN TEST
Induration: 0 mm
TB SKIN TEST: NEGATIVE

## 2017-01-26 NOTE — Progress Notes (Signed)
Pt came into clinic today or PPD read. Test was placed on her left forearm and was negative. Pt's BP was elevated on first check today. After sitting it did go back into normal range. Printed copy of PPD results provided for Pt, no further questions/concerns.

## 2017-01-26 NOTE — Progress Notes (Signed)
Agree with documentation as above.   Catherine Metheney, MD  

## 2017-04-11 ENCOUNTER — Encounter: Payer: Self-pay | Admitting: Family Medicine

## 2017-04-15 ENCOUNTER — Encounter: Payer: Self-pay | Admitting: Family Medicine

## 2017-04-18 ENCOUNTER — Ambulatory Visit (INDEPENDENT_AMBULATORY_CARE_PROVIDER_SITE_OTHER): Payer: BLUE CROSS/BLUE SHIELD | Admitting: Family Medicine

## 2017-04-18 ENCOUNTER — Encounter: Payer: Self-pay | Admitting: Family Medicine

## 2017-04-18 VITALS — BP 133/82 | HR 69 | Ht 65.0 in | Wt 183.0 lb

## 2017-04-18 DIAGNOSIS — K2100 Gastro-esophageal reflux disease with esophagitis, without bleeding: Secondary | ICD-10-CM

## 2017-04-18 DIAGNOSIS — R140 Abdominal distension (gaseous): Secondary | ICD-10-CM

## 2017-04-18 DIAGNOSIS — R195 Other fecal abnormalities: Secondary | ICD-10-CM

## 2017-04-18 DIAGNOSIS — R109 Unspecified abdominal pain: Secondary | ICD-10-CM | POA: Diagnosis not present

## 2017-04-18 DIAGNOSIS — E7800 Pure hypercholesterolemia, unspecified: Secondary | ICD-10-CM | POA: Diagnosis not present

## 2017-04-18 DIAGNOSIS — J3089 Other allergic rhinitis: Secondary | ICD-10-CM | POA: Diagnosis not present

## 2017-04-18 LAB — CBC
HCT: 37.3 % (ref 35.0–45.0)
Hemoglobin: 12.4 g/dL (ref 11.7–15.5)
MCH: 31.9 pg (ref 27.0–33.0)
MCHC: 33.2 g/dL (ref 32.0–36.0)
MCV: 95.9 fL (ref 80.0–100.0)
MPV: 11.4 fL (ref 7.5–12.5)
PLATELETS: 171 10*3/uL (ref 140–400)
RBC: 3.89 10*6/uL (ref 3.80–5.10)
RDW: 12.5 % (ref 11.0–15.0)
WBC: 2.4 10*3/uL — AB (ref 3.8–10.8)

## 2017-04-18 LAB — COMPLETE METABOLIC PANEL WITH GFR
AG RATIO: 1.6 (calc) (ref 1.0–2.5)
ALBUMIN MSPROF: 3.9 g/dL (ref 3.6–5.1)
ALT: 12 U/L (ref 6–29)
AST: 19 U/L (ref 10–30)
Alkaline phosphatase (APISO): 29 U/L — ABNORMAL LOW (ref 33–115)
BUN: 11 mg/dL (ref 7–25)
CALCIUM: 8.8 mg/dL (ref 8.6–10.2)
CO2: 28 mmol/L (ref 20–32)
CREATININE: 0.92 mg/dL (ref 0.50–1.10)
Chloride: 107 mmol/L (ref 98–110)
GFR, EST AFRICAN AMERICAN: 92 mL/min/{1.73_m2} (ref >=60)
GFR, EST NON AFRICAN AMERICAN: 79 mL/min/{1.73_m2} (ref >=60)
GLOBULIN: 2.5 g/dL (ref 1.9–3.7)
Glucose, Bld: 88 mg/dL (ref 65–99)
POTASSIUM: 4.5 mmol/L (ref 3.5–5.3)
Sodium: 139 mmol/L (ref 135–146)
TOTAL PROTEIN: 6.4 g/dL (ref 6.1–8.1)
Total Bilirubin: 0.3 mg/dL (ref 0.2–1.2)

## 2017-04-18 LAB — LIPID PANEL
CHOL/HDL RATIO: 2.5 (calc) (ref <5.0)
Cholesterol: 186 mg/dL (ref <200)
HDL: 75 mg/dL (ref >50)
LDL Cholesterol (Calc): 97 mg/dL (calc)
NON-HDL CHOLESTEROL (CALC): 111 mg/dL (ref <130)
Triglycerides: 46 mg/dL (ref <150)

## 2017-04-18 MED ORDER — FLUTICASONE PROPIONATE 50 MCG/ACT NA SUSP: 1.0000 | Freq: Every day | NASAL | 6 refills | Status: DC

## 2017-04-18 MED ORDER — PANTOPRAZOLE SODIUM 20 MG PO TBEC: 20.0000 mg | DELAYED_RELEASE_TABLET | Freq: Every day | ORAL | 1 refills | Status: DC

## 2017-04-18 NOTE — Progress Notes (Signed)
Subjective:    Patient ID: Melanie Glass, female    DOB: January 02, 1981, 37 y.o.   MRN: [id number]  HPI She c/o of sneezing, nasal congestion, scratchy thraot and watery eyes.  She has been using zyrtec but feels like it s not effective.   No fevers chills or sweats.  She typically takes Zyrtec year-round for seasonal allergies just feels like her allergies have ramped up with the spring weather.  She also recently started running a humidifier again.  She also reports that she has had increase in mucus in her stool with bloating and abdominal cramping and feeling like she is having more reflux symptoms.  Sometimes at night she has to sleep slightly inclined because of the reflux.  She did take some old Protonix that she had which was expired but it did seem to help.  She is also noticed some red particles in her stool but she does not think it is blood.  She actually thinks it may have been some undigested strawberries.  She is been having more frequent stools over all but no diarrhea or constipation.  Is having a little urgency after eating and drinking milk and dairy products and foods that are particularly rich in fiber.  She has not had any nausea or vomiting.  He did try probiotic at one time but says it actually and was made her feel more bloated.   Review of Systems  BP 133/82   Pulse 69   Ht 5\' 5"  (1.651 m)   Wt 183 lb (83 kg)   SpO2 100%   BMI 30.45 kg/m     No Known Allergies  Past Medical History:  Diagnosis Date  . Environmental and seasonal allergies     No past surgical history on file.  Social History   Socioeconomic History  . Marital status: Married    Spouse name: Not on file  . Number of children: Not on file  . Years of education: Not on file  . Highest education level: Not on file  Occupational History  . Not on file  Social Needs  . Financial resource strain: Not on file  . Food insecurity:    Worry: Not on file    Inability: Not on file  . Transportation  needs:    Medical: Not on file    Non-medical: Not on file  Tobacco Use  . Smoking status: Never Smoker  . Smokeless tobacco: Never Used  Substance and Sexual Activity  . Alcohol use: Yes  . Drug use: No  . Sexual activity: Not on file  Lifestyle  . Physical activity:    Days per week: Not on file    Minutes per session: Not on file  . Stress: Not on file  Relationships  . Social connections:    Talks on phone: Not on file    Gets together: Not on file    Attends religious service: Not on file    Active member of club or organization: Not on file    Attends meetings of clubs or organizations: Not on file    Relationship status: Not on file  . Intimate partner violence:    Fear of current or ex partner: Not on file    Emotionally abused: Not on file    Physically abused: Not on file    Forced sexual activity: Not on file  Other Topics Concern  . Not on file  Social History Narrative  . Not on file    Family  History  Problem Relation Age of Onset  . Mental illness Mother   . Hypertension Mother   . Hypertension Father     Outpatient Encounter Medications as of 04/18/2017  Medication Sig  . cetirizine (ZYRTEC) 10 MG tablet Take 10 mg by mouth as needed.   . SPRINTEC 28 0.25-35 MG-MCG tablet Take 1 tablet by mouth daily.  . fluticasone (FLONASE) 50 MCG/ACT nasal spray Place 1-2 sprays into both nostrils daily.  . pantoprazole (PROTONIX) 20 MG tablet Take 1 tablet (20 mg total) by mouth daily.  . [DISCONTINUED] Norgestimate-Ethinyl Estradiol Triphasic (TRI-SPRINTEC) 0.18/0.215/0.25 MG-35 MCG tablet Take 1 tablet by mouth daily.   No facility-administered encounter medications on file as of 04/18/2017.          Objective:   Physical Exam  Constitutional: She is oriented to person, place, and time. She appears well-developed and well-nourished.  HENT:  Head: Normocephalic and atraumatic.  Right Ear: External ear normal.  Left Ear: External ear normal.  Nose: Nose  normal.  Mouth/Throat: Oropharynx is clear and moist.  TMs and canals are clear.   Eyes: Pupils are equal, round, and reactive to light. Conjunctivae and EOM are normal.  Neck: Neck supple. No thyromegaly present.  Cardiovascular: Normal rate, regular rhythm and normal heart sounds.  Pulmonary/Chest: Effort normal and breath sounds normal. She has no wheezes.  Lymphadenopathy:    She has no cervical adenopathy.  Neurological: She is alert and oriented to person, place, and time.  Skin: Skin is warm and dry.  Psychiatric: She has a normal mood and affect.          Assessment & Plan:  AR - discussed options. Will add a nasal steroid spray. If allergies not improved in 2 weeks then please let me know. If the Flonase is working really well you can actually try dropping the Zyrtec after a couple of weeks to see if just the Flonase by itself is effective.  GERD w/ esphagitis-restart Protonix which she had taken about a year ago. New rx sent to pharmacy.  It did seem to help.  And she does get some relief when she takes Tums she just says that when she wakes up in the morning her stomach kind of hurts after using Tums so she might want to try Maalox or Mylanta instead.  Abdomina bloating and abdominal cramping-she can try probiotic.  Certainly try different brand and see if that makes a difference but at this point I think it may be worth referring her to GI since she has had symptoms on and off over the last year.

## 2017-04-18 NOTE — Patient Instructions (Addendum)
If allergies not improved in 2 weeks then please let me know.  If the Flonase is working really well you can actually try dropping the Zyrtec after a couple of weeks to see if just the Flonase by itself is effective.

## 2017-05-08 ENCOUNTER — Ambulatory Visit (INDEPENDENT_AMBULATORY_CARE_PROVIDER_SITE_OTHER): Payer: BLUE CROSS/BLUE SHIELD | Admitting: Physician Assistant

## 2017-05-08 ENCOUNTER — Encounter: Payer: Self-pay | Admitting: Physician Assistant

## 2017-05-08 VITALS — BP 124/88 | HR 88 | Ht 65.0 in | Wt 183.0 lb

## 2017-05-08 DIAGNOSIS — R194 Change in bowel habit: Secondary | ICD-10-CM | POA: Diagnosis not present

## 2017-05-08 DIAGNOSIS — K921 Melena: Secondary | ICD-10-CM | POA: Diagnosis not present

## 2017-05-08 DIAGNOSIS — K219 Gastro-esophageal reflux disease without esophagitis: Secondary | ICD-10-CM | POA: Diagnosis not present

## 2017-05-08 DIAGNOSIS — R109 Unspecified abdominal pain: Secondary | ICD-10-CM

## 2017-05-08 MED ORDER — PANTOPRAZOLE SODIUM 40 MG PO TBEC: DELAYED_RELEASE_TABLET | ORAL | 3 refills | Status: DC

## 2017-05-08 MED ORDER — PEG-KCL-NACL-NASULF-NA ASC-C 140 G PO SOLR: 1.0000 | Freq: Once | ORAL | 0 refills | Status: AC

## 2017-05-08 NOTE — Patient Instructions (Signed)
You have been scheduled for a colonoscopy. Please follow written instructions given to you at your visit today.  Please pick up your prep supplies at the pharmacy within the next 1-3 days. If you use inhalers (even only as needed), please bring them with you on the day of your procedure.  We sent prescriptions to your pharmacy. 1. Plenvu for the colonoscopy 2. Pantoprazole sodium 40 mg.   If you are age 38 or younger, your body mass index should be between 19-25. Your Body mass index is 30.45 kg/m. If this is out of the aformentioned range listed, please consider follow up with your Primary Care Provider.

## 2017-05-08 NOTE — Progress Notes (Signed)
Agree with assessment and plan as outlined.  

## 2017-05-08 NOTE — Progress Notes (Signed)
Subjective:    Patient ID: Melanie Glass, female    DOB: [birth year], 37 y.o.   MRN: [id number]  HPI Melanie Glass is a pleasant 37 year old African-American female, new to GI today referred by Dr. Beatrice Lecher for evaluation of GERD and recent changes in bowel habits, abdominal bloating discomfort and intermittent mucus in stool. Patient has not had any prior GI evaluation.  She is generally in good health. She says that she has had reflux symptoms off and on over the past few years.  She had not been on any medication other than Tums until recently.  She says she began having daily symptoms of heartburn and indigestion, particularly bothersome in the evenings making it difficult for her to lie down flat to try to sleep.  She was seen by primary care and started on Protonix 20 mg p.o. every morning a few weeks ago and says that that seems to be working well.  For the most part her symptoms have resolved.  She has no complaints of dysphagia or odynophagia.  She admits that she does eat a lot of spicy foods and that any current symptoms are probably due to her diet. She also has noticed over the past 1 to 1-1/2 years she has developed intermittent abdominal cramping, bloating and frequently gets urgency for bowel movement in the mornings.  She says she does drink coffee in the morning usually with flavored creamer.  She is not sure if the cream or may be bothering her stomach but has not been lactose intolerant in the past.  She says she can eat other dairy products cheese etc. without any issues.  Sometimes after meals within 30 minutes or so she will have urgency for bowel movement which will be normal.  She has not had any diarrhea.  She has been noticing a lot of mucus in her stools over the past several months and on few occasions has noticed some reddish specks concerning for blood in her stool as well.  He has not had any overt bleeding or melena. She says her bowels had always been normal previously and  she never had any issues with IBS etc. in the past. Family history is negative for colon cancer and polyps, negative for IBD as far she is aware.  No prior abdominal surgery Recent labs reviewed, CBC on 04/18/2017-WBC 2.4, hemoglobin 12.4, hematocrit 37.3, MCV of 95.1 platelets 171, C met unremarkable   Review of Systems;Pertinent positive and negative review of systems were noted in the above HPI section.  All other review of systems was otherwise negative.  Outpatient Encounter Medications as of 05/08/2017  Medication Sig  . cetirizine (ZYRTEC) 10 MG tablet Take 10 mg by mouth as needed.   . fluticasone (FLONASE) 50 MCG/ACT nasal spray Place 1-2 sprays into both nostrils daily.  Marland Kitchen levonorgestrel (MIRENA) 20 MCG/24HR IUD 1 each by Intrauterine route once.  . pantoprazole (PROTONIX) 20 MG tablet Take 1 tablet (20 mg total) by mouth daily.  . Probiotic Product (PROBIOTIC DAILY PO) Take by mouth.  . pantoprazole (PROTONIX) 40 MG tablet Take 1 tab by mouth every morning.  Marland Kitchen PEG-KCl-NaCl-NaSulf-Na Asc-C (PLENVU) 140 g SOLR Take 1 kit by mouth once for 1 dose.  . [DISCONTINUED] SPRINTEC 28 0.25-35 MG-MCG tablet Take 1 tablet by mouth daily.   No facility-administered encounter medications on file as of 05/08/2017.    No Known Allergies Patient Active Problem List   Diagnosis Date Noted  . DERMATOFIBROMA 06/15/2008  . HEMORRHOIDS, NOS 10/10/2005  .  GASTROESOPHAGEAL REFLUX, NO ESOPHAGITIS 10/10/2005   Social History   Socioeconomic History  . Marital status: Married    Spouse name: Not on file  . Number of children: Not on file  . Years of education: Not on file  . Highest education level: Not on file  Occupational History  . Not on file  Social Needs  . Financial resource strain: Not on file  . Food insecurity:    Worry: Not on file    Inability: Not on file  . Transportation needs:    Medical: Not on file    Non-medical: Not on file  Tobacco Use  . Smoking status: Never Smoker  .  Smokeless tobacco: Never Used  Substance and Sexual Activity  . Alcohol use: Yes    Comment: occ  . Drug use: No  . Sexual activity: Not on file  Lifestyle  . Physical activity:    Days per week: Not on file    Minutes per session: Not on file  . Stress: Not on file  Relationships  . Social connections:    Talks on phone: Not on file    Gets together: Not on file    Attends religious service: Not on file    Active member of club or organization: Not on file    Attends meetings of clubs or organizations: Not on file    Relationship status: Not on file  . Intimate partner violence:    Fear of current or ex partner: Not on file    Emotionally abused: Not on file    Physically abused: Not on file    Forced sexual activity: Not on file  Other Topics Concern  . Not on file  Social History Narrative  . Not on file    Melanie Glass's family history includes Hypertension in her father and mother; Mental illness in her mother.      Objective:    Vitals:   05/08/17 0955  BP: 124/88  Pulse: 88    Physical Exam; well-developed African-American female in no acute distress, pleasant blood pressure 124/88, pulse 88, height 5 foot 5, weight 183, BMI 30.4.  HEENT; nontraumatic normocephalic EOMI PERRLA sclera anicteric, Oropharynx benign, Cardiovascular; regular rate and rhythm with S1-S2 no murmur rub or gallop, Pulmonary; clear bilaterally, Abdomen ;soft, bowel sounds are present she has some mild tenderness in the right mid and right lower quadrant there is no guarding or rebound no palpable mass or hepatosplenomegaly, Rectal ;exam not done, Extremities; no clubbing cyanosis or edema skin warm and dry, Neuro psych; patient alert and oriented, grossly nonfocal mood and affect appropriate       Assessment & Plan:   #49 37 year old African-American female with recent persistent GERD-much improved on pantoprazole 20 mg p.o. every morning  Number two 1 to 1-1/2-year history of change in  bowel habits with intermittent abdominal discomfort, cramping, some urgency postprandially increased mucus in stool and probable small volume hematochezia with specks of blood noted in stool and one occasion of blood streaked on stool  Rule out IBS with hematochezia secondary to internal hemorrhoids.  Rule out IBD Rule out occult colon lesion   Plan; Patient will continue Protonix 20 mg p.o. every morning x3 months.  At that time advised to try to switch to Zantac 150 mg daily at dinnertime.  If that is ineffective she can continue Protonix 20 mg every morning. Discussed strict antireflux regimen and diet.  Advised n.p.o. for 3 hours prior to bedtime and elevation of  the head of the bed/back 45 degrees at nighttime Patient was given an antireflux diet and educational materials We will schedule for colonoscopy with Dr. Havery Moros.  Procedure was discussed in detail with the patient including indications risks and benefits and she is agreeable to proceed.  Patient also advised to stop using  flavored creamer in her coffee and switch to plain low-fat cream to see if this improves her early morning symptoms.  We discussed that flavored creamers contain multiple ingredients and may contain artificial sweeteners etc. which she could be intolerant to.   Jary Louvier S Gabriana Wilmott PA-C 05/08/2017   Cc: Hali Marry, *

## 2017-05-10 ENCOUNTER — Other Ambulatory Visit: Payer: Self-pay | Admitting: Family Medicine

## 2017-05-10 DIAGNOSIS — K2100 Gastro-esophageal reflux disease with esophagitis, without bleeding: Secondary | ICD-10-CM

## 2017-05-16 ENCOUNTER — Telehealth: Payer: Self-pay | Admitting: Gastroenterology

## 2017-05-16 MED ORDER — PEG-KCL-NACL-NASULF-NA ASC-C 140 G PO SOLR: 1.0000 | Freq: Once | ORAL | 0 refills | Status: AC

## 2017-05-16 NOTE — Telephone Encounter (Signed)
rx sent

## 2017-05-18 ENCOUNTER — Telehealth: Payer: Self-pay | Admitting: Physician Assistant

## 2017-05-18 NOTE — Telephone Encounter (Signed)
I called the patient to advise I spoke to the pharmacist at [facility] [address]  I sent the pantoprazole sodium 40 mg script on 05-08-2017 . Tia Alert CMA sent the Plenvu script on 05-16-17 and neither script got to [facility]. I verbally called them in today. Patient informed. I apologized for any inconvenience to the patient.

## 2017-06-01 ENCOUNTER — Other Ambulatory Visit: Payer: Self-pay

## 2017-06-01 ENCOUNTER — Emergency Department (INDEPENDENT_AMBULATORY_CARE_PROVIDER_SITE_OTHER)
Admission: EM | Admit: 2017-06-01 | Discharge: 2017-06-01 | Disposition: A | Discharge status: Home / Self Care | Payer: BLUE CROSS/BLUE SHIELD | Source: Home / Self Care | Attending: Family Medicine | Admitting: Family Medicine

## 2017-06-01 DIAGNOSIS — N898 Other specified noninflammatory disorders of vagina: Secondary | ICD-10-CM | POA: Diagnosis not present

## 2017-06-01 DIAGNOSIS — M545 Low back pain, unspecified: Secondary | ICD-10-CM

## 2017-06-01 DIAGNOSIS — R350 Frequency of micturition: Secondary | ICD-10-CM

## 2017-06-01 DIAGNOSIS — R1032 Left lower quadrant pain: Secondary | ICD-10-CM

## 2017-06-01 DIAGNOSIS — R1031 Right lower quadrant pain: Secondary | ICD-10-CM

## 2017-06-01 LAB — POCT URINALYSIS DIP (MANUAL ENTRY)
Bilirubin, UA: NEGATIVE
Blood, UA: NEGATIVE
Glucose, UA: NEGATIVE mg/dL
Leukocytes, UA: NEGATIVE
Nitrite, UA: NEGATIVE
Protein Ur, POC: NEGATIVE mg/dL
Spec Grav, UA: 1.025 (ref 1.010–1.025)
Urobilinogen, UA: 2 E.U./dL — AB
pH, UA: 7 (ref 5.0–8.0)

## 2017-06-01 NOTE — Discharge Instructions (Signed)
°  Please follow up with your family doctor next week for recheck of symptoms if not improving.

## 2017-06-01 NOTE — ED Provider Notes (Addendum)
[facility]    CSN: [id number] Arrival date & time: 06/01/17  1808     History   Chief Complaint Chief Complaint  Patient presents with  . Urinary Tract Infection    HPI Melanie Glass is a 37 y.o. female.   HPI  Melanie Glass is a 37 y.o. female presenting to UC with c/o 1 week of cloudy urine, which seemed to be getting more clear but it now has a strong odor with associated lower back pain. Denies pain with urination but has had increased urinary frequency. Denies fever, chills, n/v/d. She does report some vaginal itching but no discharge and no concern for STIs.   Past Medical History:  Diagnosis Date  . Environmental and seasonal allergies   . Uterine fibroid     Patient Active Problem List   Diagnosis Date Noted  . DERMATOFIBROMA 06/15/2008  . HEMORRHOIDS, NOS 10/10/2005  . GASTROESOPHAGEAL REFLUX, NO ESOPHAGITIS 10/10/2005    Past Surgical History:  Procedure Laterality Date  . INTRAUTERINE DEVICE (IUD) INSERTION      OB History   None      Home Medications    Prior to Admission medications   Medication Sig Start Date End Date Taking? Authorizing Provider  cetirizine (ZYRTEC) 10 MG tablet Take 10 mg by mouth as needed.     [provider]  fluticasone (FLONASE) 50 MCG/ACT nasal spray Place 1-2 sprays into both nostrils daily. 04/18/17   Hali Marry, MD  levonorgestrel (MIRENA) 20 MCG/24HR IUD 1 each by Intrauterine route once.    [provider]  pantoprazole (PROTONIX) 20 MG tablet Take 1 tablet (20 mg total) by mouth daily. 04/18/17   Hali Marry, MD  pantoprazole (PROTONIX) 40 MG tablet Take 1 tab by mouth every morning. 05/08/17   Esterwood, Amy S, PA-C  Probiotic Product (PROBIOTIC DAILY PO) Take by mouth.    [provider]    Family History Family History  Problem Relation Age of Onset  . Mental illness Mother   . Hypertension Mother   . Hypertension Father   . Stomach cancer Neg Hx   .  Colon cancer Neg Hx     Social History Social History   Tobacco Use  . Smoking status: Never Smoker  . Smokeless tobacco: Never Used  Substance Use Topics  . Alcohol use: Yes    Comment: occ  . Drug use: No     Allergies   Patient has no known allergies.   Review of Systems Review of Systems  Constitutional: Negative for chills and fever.  Gastrointestinal: Positive for abdominal pain (mild intermittent). Negative for diarrhea, nausea and vomiting.  Genitourinary: Positive for frequency and urgency. Negative for decreased urine volume, dysuria, hematuria, pelvic pain, vaginal bleeding, vaginal discharge and vaginal pain.  Musculoskeletal: Positive for back pain. Negative for myalgias.  Skin: Negative for rash.     Physical Exam Triage Vital Signs ED Triage Vitals  Enc Vitals Group     BP 06/01/17 1830 (!) 151/92     Pulse Rate 06/01/17 1830 88     Resp --      Temp 06/01/17 1830 97.6 F (36.4 C)     Temp Source 06/01/17 1830 Oral     SpO2 06/01/17 1830 100 %     Weight 06/01/17 1831 185 lb (83.9 kg)     Height 06/01/17 1831 5\' 5"  (1.651 m)     Head Circumference --      Peak Flow --  Pain Score 06/01/17 1831 0     Pain Loc --      Pain Edu? --      Excl. in [place]? --    No data found.  Updated Vital Signs BP (!) 151/92 (BP Location: Right Arm)   Pulse 88   Temp 97.6 F (36.4 C) (Oral)   Ht 5\' 5"  (1.651 m)   Wt 185 lb (83.9 kg)   SpO2 100%   BMI 30.79 kg/m   Visual Acuity Right Eye Distance:   Left Eye Distance:   Bilateral Distance:    Right Eye Near:   Left Eye Near:    Bilateral Near:     Physical Exam  Constitutional: She is oriented to person, place, and time. She appears well-developed and well-nourished. No distress.  HENT:  Head: Normocephalic and atraumatic.  Mouth/Throat: Oropharynx is clear and moist.  Eyes: EOM are normal.  Neck: Normal range of motion.  Cardiovascular: Normal rate and regular rhythm.  Pulmonary/Chest: Effort  normal and breath sounds normal. No stridor. No respiratory distress. She has no wheezes. She has no rales.  Abdominal: Soft. She exhibits no distension. There is no tenderness. There is no CVA tenderness.  Musculoskeletal: Normal range of motion.  Neurological: She is alert and oriented to person, place, and time.  Skin: Skin is warm and dry. She is not diaphoretic.  Psychiatric: She has a normal mood and affect. Her behavior is normal.  Nursing note and vitals reviewed.    UC Treatments / Results  Labs (all labs ordered are listed, but only abnormal results are displayed) Labs Reviewed  POCT URINALYSIS DIP (MANUAL ENTRY) - Abnormal; Notable for the following components:      Result Value   Ketones, POC UA trace (5) (*)    Urobilinogen, UA 2.0 (*)    All other components within normal limits  WET PREP BY MOLECULAR PROBE    EKG None  Radiology No results found.  Procedures Procedures (including critical care time)  Medications Ordered in UC Medications - No data to display  Initial Impression / Assessment and Plan / UC Course  I have reviewed the triage vital signs and the nursing notes.  Pertinent labs & imaging results that were available during my care of the patient were reviewed by me and considered in my medical decision making (see chart for details).     UA- unremarkable in UC Culture sent Wet prep from self-swab sent to lab Home care instructions provided below.   Final Clinical Impressions(s) / UC Diagnoses   Final diagnoses:  Vaginal itching  Bilateral lower abdominal cramping  Acute left-sided low back pain without sciatica  Urinary frequency     Discharge Instructions      Please follow up with your family doctor next week for recheck of symptoms if not improving.     ED Prescriptions    None     Controlled Substance Prescriptions [place] Controlled Substance Registry consulted? Not Applicable   Tyrell Antonio 06/01/17 1924      Noe Gens, PA-C 06/01/17 1925

## 2017-06-01 NOTE — ED Triage Notes (Signed)
Pt c/o urine cloudiness x 1 week. Said it appeared to clear up but then noticed a strong odor and some lower back pain. Also says urinating more frequently.

## 2017-06-02 ENCOUNTER — Telehealth: Payer: Self-pay | Admitting: Emergency Medicine

## 2017-06-02 LAB — WET PREP BY MOLECULAR PROBE
Candida species: NOT DETECTED
MICRO NUMBER:: 90659114
SPECIMEN QUALITY:: ADEQUATE
Trichomonas vaginosis: NOT DETECTED

## 2017-06-03 ENCOUNTER — Telehealth: Payer: Self-pay | Admitting: Emergency Medicine

## 2017-06-03 MED ORDER — METRONIDAZOLE 500 MG PO TABS: 500.0000 mg | ORAL_TABLET | Freq: Two times a day (BID) | ORAL | 0 refills | Status: DC

## 2017-06-03 NOTE — Telephone Encounter (Signed)
Spoke with Melanie Glass R/T her lab work, medication Flaygl was sent over to her pharmacy. She will pick up medication she was instructed to take as directed and return as needed. She verbalized an understanding.

## 2017-07-17 ENCOUNTER — Encounter: Payer: BLUE CROSS/BLUE SHIELD | Admitting: Gastroenterology

## 2017-07-31 ENCOUNTER — Encounter: Payer: Self-pay | Admitting: Family Medicine

## 2017-07-31 NOTE — Telephone Encounter (Signed)
Pt scheduled for tomorrow with Luvenia Starch .

## 2017-08-01 ENCOUNTER — Encounter: Payer: Self-pay | Admitting: Physician Assistant

## 2017-08-01 ENCOUNTER — Ambulatory Visit (INDEPENDENT_AMBULATORY_CARE_PROVIDER_SITE_OTHER): Payer: Managed Care, Other (non HMO) | Admitting: Physician Assistant

## 2017-08-01 VITALS — BP 137/97 | HR 69 | Ht 65.0 in | Wt 181.0 lb

## 2017-08-01 DIAGNOSIS — H5789 Other specified disorders of eye and adnexa: Secondary | ICD-10-CM

## 2017-08-01 NOTE — Progress Notes (Signed)
   Subjective:    Patient ID: Melanie Glass, female    DOB: 1980-02-13, 37 y.o.   MRN: [id number]  HPI Pt is a 37 yo female who presents to the clinic with right eye watering and discharge. Symptoms started last week with watery discharge. Denies any pain, itchy or significant redness. She has tried warm compresses, cold pack, visine. On Sunday night 3 days ago discharge became more thick and woke up with crusted eyes. Still no other painful or bothersome symptoms. Yesterday symptoms improved drastically. This morning little to no discharge. Continues to have no vision changes, eye pain or itching. No fever, chills or body aches. She has had some nasal congestion more right than left and some sinus pressure more right than left.   .. Active Ambulatory Problems    Diagnosis Date Noted  . DERMATOFIBROMA 06/15/2008  . HEMORRHOIDS, NOS 10/10/2005  . GASTROESOPHAGEAL REFLUX, NO ESOPHAGITIS 10/10/2005   Resolved Ambulatory Problems    Diagnosis Date Noted  . CANDIDIASIS, VAGINAL 04/03/2009  . SINUSITIS - ACUTE-NOS 11/02/2009  . BACTERIAL VAGINITIS 04/03/2009  . Needs flu shot 11/02/2015   Past Medical History:  Diagnosis Date  . Environmental and seasonal allergies   . Uterine fibroid       Review of Systems    see HPI.  Objective:   Physical Exam  Constitutional: She is oriented to person, place, and time. She appears well-developed and well-nourished.  HENT:  Head: Normocephalic and atraumatic.  Mouth/Throat: Oropharynx is clear and moist. No oropharyngeal exudate.  Right nasal turbinates red and swollen. Left nasal turbinates normal appearance.  Some slight pressure with palpation of lacrimal duct of right eye.  No sty or external eye pathology.   Eyes: Pupils are equal, round, and reactive to light. EOM are normal. Right eye exhibits no discharge. Left eye exhibits no discharge.  Inner eyelid of right eye slightly erythematous. Bilateral conjunctiva without redness.   Neck:  Normal range of motion. Neck supple. No thyromegaly present.  Cardiovascular: Normal rate.  Neurological: She is alert and oriented to person, place, and time.  Psychiatric: She has a normal mood and affect. Her behavior is normal.          Assessment & Plan:  .Marland KitchenCalianne was seen today for eye problem.  Diagnoses and all orders for this visit:  Discharge of right eye   Discussed etiology of eye discharge such as foreign body, allergic, viral, bacterial conjunctivitis. Today symptoms have resolved. Continue warm compresses. If symptoms worsen please send new info via mychart.

## 2017-08-10 ENCOUNTER — Encounter: Payer: Managed Care, Other (non HMO) | Admitting: Gastroenterology

## 2017-09-17 ENCOUNTER — Encounter: Payer: Self-pay | Admitting: Gastroenterology

## 2017-09-17 ENCOUNTER — Ambulatory Visit (AMBULATORY_SURGERY_CENTER): Payer: Self-pay | Admitting: *Deleted

## 2017-09-17 VITALS — Ht 65.0 in | Wt 181.0 lb

## 2017-09-17 DIAGNOSIS — K921 Melena: Secondary | ICD-10-CM

## 2017-09-17 DIAGNOSIS — R194 Change in bowel habit: Secondary | ICD-10-CM

## 2017-09-17 NOTE — Progress Notes (Signed)
No egg or soy allergy known to patient  No issues with past sedation with any surgeries  or procedures, no intubation problems  No diet pills per patient No home 02 use per patient  No blood thinners per patient  Pt denies issues with constipation  No A fib or A flutter  EMMI video sent to pt's e mail - pt has watched already Pt states she has a plenvu at home from past OV

## 2017-10-01 ENCOUNTER — Ambulatory Visit (AMBULATORY_SURGERY_CENTER): Payer: Managed Care, Other (non HMO) | Admitting: Gastroenterology

## 2017-10-01 ENCOUNTER — Encounter: Payer: Self-pay | Admitting: Gastroenterology

## 2017-10-01 VITALS — BP 135/89 | HR 63 | Temp 97.8°F | Resp 14 | Ht 65.0 in | Wt 181.0 lb

## 2017-10-01 DIAGNOSIS — K921 Melena: Secondary | ICD-10-CM | POA: Diagnosis not present

## 2017-10-01 DIAGNOSIS — R194 Change in bowel habit: Secondary | ICD-10-CM

## 2017-10-01 MED ORDER — SODIUM CHLORIDE 0.9 % IV SOLN: 500.0000 mL | Freq: Once | INTRAVENOUS | Status: DC

## 2017-10-01 NOTE — Op Note (Signed)
[facility] Patient Name: Melanie Glass Procedure Date: 10/01/2017 7:55 AM MRN: [id number] Endoscopist: Remo Lipps P. Havery Moros , MD Age: 37 Referring MD:  Date of Birth: Nov 09, 1980 Gender: Female Account #: [id number] Procedure:                Colonoscopy Indications:              Hematochezia, Change in bowel habits that occured a                            few months ago, symptoms have since improved /                            resolved Medicines:                Monitored Anesthesia Care Procedure:                Pre-Anesthesia Assessment:                           - Prior to the procedure, a History and Physical                            was performed, and patient medications and                            allergies were reviewed. The patient's tolerance of                            previous anesthesia was also reviewed. The risks                            and benefits of the procedure and the sedation                            options and risks were discussed with the patient.                            All questions were answered, and informed consent                            was obtained. Prior Anticoagulants: The patient has                            taken no previous anticoagulant or antiplatelet                            agents. ASA Grade Assessment: II - A patient with                            mild systemic disease. After reviewing the risks                            and benefits, the patient was deemed in  satisfactory condition to undergo the procedure.                           After obtaining informed consent, the colonoscope                            was passed under direct vision. Throughout the                            procedure, the patient's blood pressure, pulse, and                            oxygen saturations were monitored continuously. The                            Model PCF-H190DL [id number]) scope was  introduced                            through the anus and advanced to the the terminal                            ileum, with identification of the appendiceal                            orifice and IC valve. The colonoscopy was performed                            without difficulty. The patient tolerated the                            procedure well. The quality of the bowel                            preparation was adequate. The terminal ileum,                            ileocecal valve, appendiceal orifice, and rectum                            were photographed. Scope In: 8:03:18 AM Scope Out: 8:25:02 AM Scope Withdrawal Time: 0 hours 14 minutes 12 seconds  Total Procedure Duration: 0 hours 21 minutes 44 seconds  Findings:                 The perianal and digital rectal examinations were                            normal.                           The terminal ileum appeared normal.                           Internal hemorrhoids were found during  retroflexion. The hemorrhoids were small.                           The colon was tortuous.                           The exam was otherwise without abnormality. No                            polyps or inflammatory changes. Complications:            No immediate complications. Estimated blood loss:                            Minimal. Estimated Blood Loss:     Estimated blood loss was minimal. Impression:               - The examined portion of the ileum was normal.                           - Internal hemorrhoids.                           - Tortuous colon.                           - The examination was otherwise normal.                           - No polyps or mass lesions. No inflammatory changes                           Suspect bleeding was likely due to hemorrhoids,                            symptoms appeared to have improved. Recommendation:           - Patient has a contact number available for                             emergencies. The signs and symptoms of potential                            delayed complications were discussed with the                            patient. Return to normal activities tomorrow.                            Written discharge instructions were provided to the                            patient.                           - Resume previous diet.                           -  Continue present medications.                           - Repeat colonoscopy in 10 years for screening                            purposes.                           - Follow up as needed if symptoms recur in the                            future Steven P. Armbruster, MD 10/01/2017 8:29:20 AM This report has been signed electronically.

## 2017-10-01 NOTE — Patient Instructions (Signed)
Handout given on hemorrhoids.   YOU HAD AN ENDOSCOPIC PROCEDURE TODAY AT [facility]:   Refer to the procedure report that was given to you for any specific questions about what was found during the examination.  If the procedure report does not answer your questions, please call your gastroenterologist to clarify.  If you requested that your care partner not be given the details of your procedure findings, then the procedure report has been included in a sealed envelope for you to review at your convenience later.  YOU SHOULD EXPECT: Some feelings of bloating in the abdomen. Passage of more gas than usual.  Walking can help get rid of the air that was put into your GI tract during the procedure and reduce the bloating. If you had a lower endoscopy (such as a colonoscopy or flexible sigmoidoscopy) you may notice spotting of blood in your stool or on the toilet paper. If you underwent a bowel prep for your procedure, you may not have a normal bowel movement for a few days.  Please Note:  You might notice some irritation and congestion in your nose or some drainage.  This is from the oxygen used during your procedure.  There is no need for concern and it should clear up in a day or so.  SYMPTOMS TO REPORT IMMEDIATELY:   Following lower endoscopy (colonoscopy or flexible sigmoidoscopy):  Excessive amounts of blood in the stool  Significant tenderness or worsening of abdominal pains  Swelling of the abdomen that is new, acute  Fever of 100F or higher   For urgent or emergent issues, a gastroenterologist can be reached at any hour by calling [phone].   DIET:  We do recommend a small meal at first, but then you may proceed to your regular diet.  Drink plenty of fluids but you should avoid alcoholic beverages for 24 hours.  ACTIVITY:  You should plan to take it easy for the rest of today and you should NOT DRIVE or use heavy machinery until tomorrow (because of the sedation  medicines used during the test).    FOLLOW UP: Our staff will call the number listed on your records the next business day following your procedure to check on you and address any questions or concerns that you may have regarding the information given to you following your procedure. If we do not reach you, we will leave a message.  However, if you are feeling well and you are not experiencing any problems, there is no need to return our call.  We will assume that you have returned to your regular daily activities without incident.  If any biopsies were taken you will be contacted by phone or by letter within the next 1-3 weeks.  Please call us at [phone] if you have not heard about the biopsies in 3 weeks.    SIGNATURES/CONFIDENTIALITY: You and/or your care partner have signed paperwork which will be entered into your electronic medical record.  These signatures attest to the fact that that the information above on your After Visit Summary has been reviewed and is understood.  Full responsibility of the confidentiality of this discharge information lies with you and/or your care-partner.

## 2017-10-01 NOTE — Progress Notes (Signed)
To PACU, VSS. Report to [email or web address].tb 

## 2017-10-02 ENCOUNTER — Telehealth: Payer: Self-pay

## 2017-10-02 NOTE — Telephone Encounter (Signed)
  Follow up Call-  Call back number 10/01/2017  Post procedure Call Back phone  # [phone]  Permission to leave phone message Yes  Some recent data might be hidden     Patient questions:  Do you have a fever, pain , or abdominal swelling? No. Pain Score  0 *  Have you tolerated food without any problems? Yes.    Have you been able to return to your normal activities? Yes.    Do you have any questions about your discharge instructions: Diet   No. Medications  No. Follow up visit  No.  Do you have questions or concerns about your Care? No.  Actions: * If pain score is 4 or above: No action needed, pain <4.

## 2017-11-02 ENCOUNTER — Emergency Department (INDEPENDENT_AMBULATORY_CARE_PROVIDER_SITE_OTHER)
Admission: EM | Admit: 2017-11-02 | Discharge: 2017-11-02 | Disposition: A | Discharge status: Home / Self Care | Payer: Managed Care, Other (non HMO) | Source: Home / Self Care

## 2017-11-02 ENCOUNTER — Other Ambulatory Visit: Payer: Self-pay

## 2017-11-02 ENCOUNTER — Encounter: Payer: Self-pay | Admitting: Emergency Medicine

## 2017-11-02 DIAGNOSIS — Z23 Encounter for immunization: Secondary | ICD-10-CM | POA: Diagnosis not present

## 2017-11-02 MED ORDER — INFLUENZA VAC SPLIT QUAD 0.5 ML IM SUSY
0.5000 mL | PREFILLED_SYRINGE | INTRAMUSCULAR | Status: AC
  Administered 2017-11-02: 0.5 mL via INTRAMUSCULAR

## 2017-11-02 NOTE — ED Triage Notes (Signed)
Patient desires influenza immunization.

## 2017-11-16 ENCOUNTER — Other Ambulatory Visit: Payer: Self-pay | Admitting: Physician Assistant

## 2018-07-12 ENCOUNTER — Encounter: Payer: Self-pay | Admitting: Family Medicine

## 2018-07-16 ENCOUNTER — Ambulatory Visit (INDEPENDENT_AMBULATORY_CARE_PROVIDER_SITE_OTHER): Payer: Managed Care, Other (non HMO) | Admitting: Family Medicine

## 2018-07-16 ENCOUNTER — Encounter: Payer: Self-pay | Admitting: Family Medicine

## 2018-07-16 ENCOUNTER — Other Ambulatory Visit: Payer: Self-pay

## 2018-07-16 VITALS — BP 124/84 | HR 80 | Ht 65.0 in | Wt 180.0 lb

## 2018-07-16 DIAGNOSIS — J302 Other seasonal allergic rhinitis: Secondary | ICD-10-CM

## 2018-07-16 DIAGNOSIS — Z111 Encounter for screening for respiratory tuberculosis: Secondary | ICD-10-CM | POA: Diagnosis not present

## 2018-07-16 DIAGNOSIS — H938X3 Other specified disorders of ear, bilateral: Secondary | ICD-10-CM | POA: Diagnosis not present

## 2018-07-16 DIAGNOSIS — L739 Follicular disorder, unspecified: Secondary | ICD-10-CM

## 2018-07-16 MED ORDER — PREDNISONE 20 MG PO TABS: ORAL_TABLET | ORAL | 0 refills | Status: AC

## 2018-07-16 NOTE — Progress Notes (Signed)
Established Patient Office Visit  Subjective:  Patient ID: Melanie Glass, female    DOB: [birth year]  Age: 38 y.o. MRN: [id number]  CC:  Chief Complaint  Patient presents with  . PPD Placement  . Sinusitis    HPI Melanie Glass presents for needs two step TB skin test.  She also has a pimple on the groin area which she squeezed and expressed.  She would like me to look at it today.  She put which hazel on it and then a little bit of antibiotic cream and covered it with a Band-Aid.  Also complains of some sinus congestion that started on Saturday.  She had some left ear pain a couple weeks ago that was intense enough that she ended up using some ibuprofen and Tylenol.  She never had any drainage from the ear or hearing loss but it was painful and tender even behind the ear for about 5 days and then it seemed to improve.  But since Saturday when the nasal congestion mostly over the right maxillary sinus started she also started to get some right ear pain just not as severe as it was in the left.  She denies any hearing loss.  She does describe fullness sensation.  She is been taking her Zyrtec regularly.  She has held her nasal steroid spray temporarily because of nasal irritation and bleeding.  She is been using some Mucinex for the congestion, and that has helped..  No fevers, sweats, or chills.       Past Medical History:  Diagnosis Date  . Allergy   . Environmental and seasonal allergies   . GERD (gastroesophageal reflux disease)   . Uterine fibroid     Past Surgical History:  Procedure Laterality Date  . INTRAUTERINE DEVICE (IUD) INSERTION      Family History  Problem Relation Age of Onset  . Mental illness Mother   . Hypertension Mother   . Hypertension Father   . Pancreatic cancer Paternal Uncle   . Stomach cancer Neg Hx   . Colon cancer Neg Hx   . Colon polyps Neg Hx   . Esophageal cancer Neg Hx   . Rectal cancer Neg Hx     Social History   Socioeconomic History  .  Marital status: Married    Spouse name: Not on file  . Number of children: Not on file  . Years of education: Not on file  . Highest education level: Not on file  Occupational History  . Not on file  Social Needs  . Financial resource strain: Not on file  . Food insecurity    Worry: Not on file    Inability: Not on file  . Transportation needs    Medical: Not on file    Non-medical: Not on file  Tobacco Use  . Smoking status: Never Smoker  . Smokeless tobacco: Never Used  Substance and Sexual Activity  . Alcohol use: Yes    Comment: occ  . Drug use: No  . Sexual activity: Not on file  Lifestyle  . Physical activity    Days per week: Not on file    Minutes per session: Not on file  . Stress: Not on file  Relationships  . Social Herbalist on phone: Not on file    Gets together: Not on file    Attends religious service: Not on file    Active member of club or organization: Not on file    Attends  meetings of clubs or organizations: Not on file    Relationship status: Not on file  . Intimate partner violence    Fear of current or ex partner: Not on file    Emotionally abused: Not on file    Physically abused: Not on file    Forced sexual activity: Not on file  Other Topics Concern  . Not on file  Social History Narrative  . Not on file    Outpatient Medications Prior to Visit  Medication Sig Dispense Refill  . cetirizine (ZYRTEC) 10 MG tablet Take 10 mg by mouth as needed.     . fluticasone (FLONASE) 50 MCG/ACT nasal spray Place 1-2 sprays into both nostrils daily. 16 g 6  . guaiFENesin (MUCINEX PO) Take by mouth as needed.    Marland Kitchen levonorgestrel (MIRENA) 20 MCG/24HR IUD 1 each by Intrauterine route once.    . pantoprazole (PROTONIX) 40 MG tablet TAKE 1 TABLET EVERY DAY IN THE MORNING 90 tablet 3  . Probiotic Product (PROBIOTIC DAILY PO) Take by mouth.     No facility-administered medications prior to visit.     No Known Allergies  ROS Review of  Systems    Objective:    Physical Exam  Constitutional: She is oriented to person, place, and time. She appears well-developed and well-nourished.  HENT:  Head: Normocephalic and atraumatic.  Right Ear: External ear normal.  Left Ear: External ear normal.  Nose: Nose normal.  Mouth/Throat: Oropharynx is clear and moist.  Left tympanic membrane with no erythema but there are a few bubbles at the bottom as well as a distorted light reflex.  Right tympanic membrane looks retracted but no fluid levels.  Eyes: Pupils are equal, round, and reactive to light. Conjunctivae and EOM are normal.  Neck: Neck supple. No thyromegaly present.  Cardiovascular: Normal rate, regular rhythm and normal heart sounds.  Pulmonary/Chest: Effort normal and breath sounds normal. She has no wheezes.  Lymphadenopathy:    She has no cervical adenopathy.  Neurological: She is alert and oriented to person, place, and time.  Skin: Skin is warm and dry.  On the right side of the mons pubis there is a small papule that slightly hyperpigmented.  No active drainage.  No significant surrounding erythema.  Psychiatric: She has a normal mood and affect.    BP 124/84   Pulse 80   Ht 5\' 5"  (1.651 m)   Wt 180 lb (81.6 kg)   SpO2 100%   BMI 29.95 kg/m  Wt Readings from Last 3 Encounters:  07/16/18 180 lb (81.6 kg)  11/02/17 174 lb (78.9 kg)  10/01/17 181 lb (82.1 kg)     Health Maintenance Due  Topic Date Due  . PAP SMEAR-Modifier  04/02/2016    There are no preventive care reminders to display for this patient.  Lab Results  Component Value Date   TSH 0.86 11/02/2015   Lab Results  Component Value Date   WBC 2.4 (L) 04/18/2017   HGB 12.4 04/18/2017   HCT 37.3 04/18/2017   MCV 95.9 04/18/2017   PLT 171 04/18/2017   Lab Results  Component Value Date   NA 139 04/18/2017   K 4.5 04/18/2017   CO2 28 04/18/2017   GLUCOSE 88 04/18/2017   BUN 11 04/18/2017   CREATININE 0.92 04/18/2017   BILITOT 0.3  04/18/2017   ALKPHOS 38 07/03/2016   AST 19 04/18/2017   ALT 12 04/18/2017   PROT 6.4 04/18/2017   ALBUMIN 4.0 07/03/2016  CALCIUM 8.8 04/18/2017   Lab Results  Component Value Date   CHOL 186 04/18/2017   Lab Results  Component Value Date   HDL 75 04/18/2017   Lab Results  Component Value Date   LDLCALC 97 04/18/2017   Lab Results  Component Value Date   TRIG 46 04/18/2017   Lab Results  Component Value Date   CHOLHDL 2.5 04/18/2017   No results found for: HGBA1C    Assessment & Plan:   Problem List Items Addressed This Visit    None    Visit Diagnoses    PPD screening test    -  Primary   Relevant Orders   PPD (Completed)   Seasonal allergic rhinitis, unspecified trigger       Relevant Orders   Ambulatory referral to Allergy   Ear fullness, bilateral       Relevant Medications   predniSONE (DELTASONE) 20 MG tablet   Folliculitis          PPD screening-first PPD placed today.  Will need read in 48 to 72 hours.  She needs a two-step so need to schedule I week out after the initial for the second part.  Did have her complete the screening form that she brought with her today.  Ear fullness, bilateral-she initially had some left ear pain about 2 weeks ago that is improved but now has some right ear discomfort.  Both eardrums look retracted suspect related to some eustachian tube dysfunction so I want her to try prednisone as she has already been taking allergy medicine and regularly.  We also discussed more long-term that it might be helpful to get her in with an allergy specialist.  We will go ahead and make referral today to [facility] here in [place].  Folliculitis-on the right side of the mons pubis there is just a slightly swollen area with a little bit of hyperpigmentation.  Recommend warm compresses and continue applying her witch hazel.  Since it did drain on its own we will monitor it for the next couple days if at any point it looks like it is  not healing or it suddenly gets worse then please call back and we can put her on an antibiotic or if it needs to be drained we can do so here in the office.  Meds ordered this encounter  Medications  . predniSONE (DELTASONE) 20 MG tablet    Sig: Take 2 tablets (40 mg total) by mouth daily with breakfast for 3 days, THEN 1 tablet (20 mg total) daily with breakfast for 3 days.    Dispense:  9 tablet    Refill:  0    Follow-up: No follow-ups on file.    Beatrice Lecher, MD

## 2018-07-16 NOTE — Patient Instructions (Signed)
Return in 48 to 72 hours to have the TB skin test read.  This can be done with a nurse visit.  Then 1 week after you have it read you can schedule for the second TB skin test.

## 2018-07-16 NOTE — Progress Notes (Signed)
PPD test done today while pt was in the office. Test placed in L forearm. She will RTC in 48-72 hours for reading.Maryruth Eve, Lahoma Crocker, CMA

## 2018-07-19 ENCOUNTER — Ambulatory Visit (INDEPENDENT_AMBULATORY_CARE_PROVIDER_SITE_OTHER): Payer: Managed Care, Other (non HMO) | Admitting: Family Medicine

## 2018-07-19 ENCOUNTER — Encounter: Payer: Self-pay | Admitting: Family Medicine

## 2018-07-19 ENCOUNTER — Other Ambulatory Visit: Payer: Self-pay

## 2018-07-19 DIAGNOSIS — Z111 Encounter for screening for respiratory tuberculosis: Secondary | ICD-10-CM | POA: Diagnosis not present

## 2018-07-19 LAB — TB SKIN TEST
Induration: 0 mm
TB Skin Test: NEGATIVE

## 2018-07-19 NOTE — Progress Notes (Signed)
Agree with documentation as above.   Darroll Bredeson, MD  

## 2018-07-19 NOTE — Progress Notes (Signed)
Pt here for PPD read.  PPD negative.    Pt is completing 2-step TB testing. Pt advised to schedule next TB skin test. Results from this TB screen have been printed and attached to pt's form. Forms returned to [employer].

## 2018-07-26 ENCOUNTER — Ambulatory Visit: Payer: Managed Care, Other (non HMO)

## 2018-07-29 ENCOUNTER — Ambulatory Visit: Payer: Managed Care, Other (non HMO)

## 2018-07-30 ENCOUNTER — Ambulatory Visit: Payer: Managed Care, Other (non HMO) | Admitting: Family Medicine

## 2018-07-30 ENCOUNTER — Other Ambulatory Visit: Payer: Self-pay

## 2018-07-30 VITALS — Temp 98.6°F

## 2018-07-30 DIAGNOSIS — Z111 Encounter for screening for respiratory tuberculosis: Secondary | ICD-10-CM | POA: Diagnosis not present

## 2018-07-30 NOTE — Progress Notes (Signed)
Agree with documentation as above.   Tripp Goins, MD  

## 2018-07-30 NOTE — Progress Notes (Signed)
Pt presented to office for second TB test.  Pt tolerated well in right forearm.

## 2018-08-01 ENCOUNTER — Ambulatory Visit (INDEPENDENT_AMBULATORY_CARE_PROVIDER_SITE_OTHER): Payer: Managed Care, Other (non HMO) | Admitting: Family Medicine

## 2018-08-01 ENCOUNTER — Other Ambulatory Visit: Payer: Self-pay

## 2018-08-01 VITALS — BP 117/78 | HR 82 | Temp 99.0°F | Ht 64.5 in | Wt 179.0 lb

## 2018-08-01 DIAGNOSIS — Z111 Encounter for screening for respiratory tuberculosis: Secondary | ICD-10-CM

## 2018-08-01 LAB — TB SKIN TEST
Induration: 0 mm
TB Skin Test: NEGATIVE

## 2018-08-01 NOTE — Progress Notes (Signed)
Agree with documentation as above.   Elliana Bal, MD  

## 2018-08-01 NOTE — Progress Notes (Signed)
Patient was in the office for PPD reading. There was no rash or redness at or near the injection spot. Results were documented as negative and a copy of results was given to the patient. Azizah Lisle,CMA

## 2018-09-28 ENCOUNTER — Emergency Department (INDEPENDENT_AMBULATORY_CARE_PROVIDER_SITE_OTHER)
Admission: EM | Admit: 2018-09-28 | Discharge: 2018-09-28 | Disposition: A | Discharge status: Home / Self Care | Payer: Managed Care, Other (non HMO) | Source: Home / Self Care

## 2018-09-28 ENCOUNTER — Other Ambulatory Visit: Payer: Self-pay

## 2018-09-28 ENCOUNTER — Encounter: Payer: Self-pay | Admitting: Emergency Medicine

## 2018-09-28 DIAGNOSIS — Z23 Encounter for immunization: Secondary | ICD-10-CM | POA: Diagnosis not present

## 2018-09-28 MED ORDER — INFLUENZA VAC SPLIT QUAD 0.5 ML IM SUSY: 0.5000 mL | PREFILLED_SYRINGE | INTRAMUSCULAR | Status: DC

## 2018-09-28 MED ORDER — INFLUENZA VAC SPLIT QUAD 0.5 ML IM SUSY
0.5000 mL | PREFILLED_SYRINGE | Freq: Once | INTRAMUSCULAR | Status: AC
  Administered 2018-09-28: 0.5 mL via INTRAMUSCULAR

## 2018-09-28 NOTE — ED Triage Notes (Signed)
Desires influenza vaccination.

## 2018-11-05 ENCOUNTER — Other Ambulatory Visit: Payer: Self-pay | Admitting: Physician Assistant

## 2018-12-18 ENCOUNTER — Encounter: Payer: Self-pay | Admitting: Family Medicine

## 2018-12-18 ENCOUNTER — Ambulatory Visit (INDEPENDENT_AMBULATORY_CARE_PROVIDER_SITE_OTHER): Payer: Managed Care, Other (non HMO) | Admitting: Physician Assistant

## 2018-12-18 ENCOUNTER — Other Ambulatory Visit: Payer: Self-pay

## 2018-12-18 ENCOUNTER — Encounter: Payer: Self-pay | Admitting: Physician Assistant

## 2018-12-18 VITALS — Temp 97.3°F | Ht 65.0 in | Wt 175.0 lb

## 2018-12-18 DIAGNOSIS — H9201 Otalgia, right ear: Secondary | ICD-10-CM

## 2018-12-18 DIAGNOSIS — J0140 Acute pansinusitis, unspecified: Secondary | ICD-10-CM

## 2018-12-18 DIAGNOSIS — Z8619 Personal history of other infectious and parasitic diseases: Secondary | ICD-10-CM | POA: Diagnosis not present

## 2018-12-18 DIAGNOSIS — Z8616 Personal history of COVID-19: Secondary | ICD-10-CM

## 2018-12-18 MED ORDER — AMOXICILLIN-POT CLAVULANATE 875-125 MG PO TABS: 1.0000 | ORAL_TABLET | Freq: Two times a day (BID) | ORAL | 0 refills | Status: DC

## 2018-12-18 MED ORDER — METHYLPREDNISOLONE 4 MG PO TBPK: ORAL_TABLET | ORAL | 0 refills | Status: DC

## 2018-12-18 NOTE — Progress Notes (Deleted)
Started Monday: Pain in right ear No hearing loss No injury No dizziness No ringing in ears  S/p Covid DX: 12/03/2018  No sore throat, did have congestion/runny nose from Covid, but better now

## 2018-12-18 NOTE — Progress Notes (Signed)
Patient ID: Melanie Glass, female   DOB: 08/09/1980, 38 y.o.   MRN: [id number] .Marland KitchenVirtual Visit via Video Note  I connected with Melanie Glass on 12/18/18 at 11:30 AM EST by a video enabled telemedicine application and verified that I am speaking with the correct person using two identifiers.  Location: Patient: home Provider: clinic   I discussed the limitations of evaluation and management by telemedicine and the availability of in person appointments. The patient expressed understanding and agreed to proceed.  History of Present Illness: Patient is a 38 year old female who calls into the clinic with right ear pain and sinus pressure.  She was diagnosed with Covid on 12/03/2018.  Her symptoms are fairly mild with mostly upper respiratory.  She did have cough and runny nose as well as congestion with her Covid symptoms.  She began to feel a lot better until Monday she started having pain in her right ear.  She denies any hearing loss, injury, dizziness, tinnitus.  She is using Flonase daily.  She denies any fever, chills, body aches.  .. Active Ambulatory Problems    Diagnosis Date Noted  . DERMATOFIBROMA 06/15/2008  . HEMORRHOIDS, NOS 10/10/2005  . GASTROESOPHAGEAL REFLUX, NO ESOPHAGITIS 10/10/2005   Resolved Ambulatory Problems    Diagnosis Date Noted  . CANDIDIASIS, VAGINAL 04/03/2009  . SINUSITIS - ACUTE-NOS 11/02/2009  . BACTERIAL VAGINITIS 04/03/2009  . Needs flu shot 11/02/2015   Past Medical History:  Diagnosis Date  . Allergy   . Environmental and seasonal allergies   . GERD (gastroesophageal reflux disease)   . Uterine fibroid    Reviewed med, allergy, problem list.     Observations/Objective: No acute distress.  Normal breathing.  No cough or wheezing.  Normal appearance.   .. Today's Vitals   12/18/18 1105  Temp: (!) 97.3 F (36.3 C)  TempSrc: Oral  Weight: 175 lb (79.4 kg)  Height: 5\' 5"  (1.651 m)   Body mass index is 29.12 kg/m.    Assessment and  Plan: .Marland KitchenKierria was seen today for ear pain.  Diagnoses and all orders for this visit:  Acute non-recurrent pansinusitis -     amoxicillin-clavulanate (AUGMENTIN) 875-125 MG tablet; Take 1 tablet by mouth 2 (two) times daily.  Right ear pain -     methylPREDNISolone (MEDROL DOSEPAK) 4 MG TBPK tablet; Take as directed by package insert.  History of 2019 novel coronavirus disease (COVID-19)   Not able to visualize ear but patient has had URI symptoms for 16 days. Likely some secondary bacterial infection causing sinusitis and possible otitis media. Sent Augmentin and medrol dose pack. Could be more ETD. Continue flonase. Follow up as needed.    Follow Up Instructions:    I discussed the assessment and treatment plan with the patient. The patient was provided an opportunity to ask questions and all were answered. The patient agreed with the plan and demonstrated an understanding of the instructions.   The patient was advised to call back or seek an in-person evaluation if the symptoms worsen or if the condition fails to improve as anticipated.   Iran Planas, PA-C

## 2019-01-29 ENCOUNTER — Other Ambulatory Visit: Payer: Self-pay | Admitting: Nurse Practitioner

## 2019-01-30 ENCOUNTER — Ambulatory Visit (INDEPENDENT_AMBULATORY_CARE_PROVIDER_SITE_OTHER): Payer: Managed Care, Other (non HMO)

## 2019-01-30 ENCOUNTER — Ambulatory Visit (INDEPENDENT_AMBULATORY_CARE_PROVIDER_SITE_OTHER): Payer: Managed Care, Other (non HMO) | Admitting: Family Medicine

## 2019-01-30 ENCOUNTER — Encounter: Payer: Self-pay | Admitting: Family Medicine

## 2019-01-30 ENCOUNTER — Other Ambulatory Visit: Payer: Self-pay

## 2019-01-30 VITALS — BP 116/78 | HR 70 | Ht 65.0 in | Wt 176.0 lb

## 2019-01-30 DIAGNOSIS — Z1231 Encounter for screening mammogram for malignant neoplasm of breast: Secondary | ICD-10-CM

## 2019-01-30 DIAGNOSIS — Z0000 Encounter for general adult medical examination without abnormal findings: Secondary | ICD-10-CM

## 2019-01-30 DIAGNOSIS — R0981 Nasal congestion: Secondary | ICD-10-CM

## 2019-01-30 LAB — COMPLETE METABOLIC PANEL WITH GFR
AG Ratio: 1.8 (calc) (ref 1.0–2.5)
ALT: 14 U/L (ref 6–29)
AST: 17 U/L (ref 10–30)
Albumin: 4.2 g/dL (ref 3.6–5.1)
Alkaline phosphatase (APISO): 36 U/L (ref 31–125)
BUN: 13 mg/dL (ref 7–25)
CO2: 28 mmol/L (ref 20–32)
Calcium: 9.3 mg/dL (ref 8.6–10.2)
Chloride: 105 mmol/L (ref 98–110)
Creat: 0.87 mg/dL (ref 0.50–1.10)
GFR, Est African American: 97 mL/min/{1.73_m2} (ref >=60)
GFR, Est Non African American: 83 mL/min/{1.73_m2} (ref >=60)
Globulin: 2.3 g/dL (calc) (ref 1.9–3.7)
Glucose, Bld: 87 mg/dL (ref 65–99)
Potassium: 4.2 mmol/L (ref 3.5–5.3)
Sodium: 139 mmol/L (ref 135–146)
Total Bilirubin: 0.6 mg/dL (ref 0.2–1.2)
Total Protein: 6.5 g/dL (ref 6.1–8.1)

## 2019-01-30 LAB — CBC
HCT: 39.2 % (ref 35.0–45.0)
Hemoglobin: 12.8 g/dL (ref 11.7–15.5)
MCH: 32.2 pg (ref 27.0–33.0)
MCHC: 32.7 g/dL (ref 32.0–36.0)
MCV: 98.7 fL (ref 80.0–100.0)
MPV: 10.9 fL (ref 7.5–12.5)
Platelets: 193 10*3/uL (ref 140–400)
RBC: 3.97 10*6/uL (ref 3.80–5.10)
RDW: 12.2 % (ref 11.0–15.0)
WBC: 2.7 10*3/uL — ABNORMAL LOW (ref 3.8–10.8)

## 2019-01-30 LAB — LIPID PANEL
Cholesterol: 211 mg/dL — ABNORMAL HIGH (ref <200)
HDL: 78 mg/dL (ref >=50)
LDL Cholesterol (Calc): 123 mg/dL (calc) — ABNORMAL HIGH
Non-HDL Cholesterol (Calc): 133 mg/dL (calc) — ABNORMAL HIGH (ref <130)
Total CHOL/HDL Ratio: 2.7 (calc) (ref <5.0)
Triglycerides: 34 mg/dL (ref <150)

## 2019-01-30 NOTE — Patient Instructions (Signed)
 Preventive Care 40-39 Years Old, Female Preventive care refers to visits with your health care provider and lifestyle choices that can promote health and wellness. This includes:  A yearly physical exam. This may also be called an annual well check.  Regular dental visits and eye exams.  Immunizations.  Screening for certain conditions.  Healthy lifestyle choices, such as eating a healthy diet, getting regular exercise, not using drugs or products that contain nicotine and tobacco, and limiting alcohol use. What can I expect for my preventive care visit? Physical exam Your health care provider will check your:  Height and weight. This may be used to calculate body mass index (BMI), which tells if you are at a healthy weight.  Heart rate and blood pressure.  Skin for abnormal spots. Counseling Your health care provider may ask you questions about your:  Alcohol, tobacco, and drug use.  Emotional well-being.  Home and relationship well-being.  Sexual activity.  Eating habits.  Work and work environment.  Method of birth control.  Menstrual cycle.  Pregnancy history. What immunizations do I need?  Influenza (flu) vaccine  This is recommended every year. Tetanus, diphtheria, and pertussis (Tdap) vaccine  You may need a Td booster every 10 years. Varicella (chickenpox) vaccine  You may need this if you have not been vaccinated. Human papillomavirus (HPV) vaccine  If recommended by your health care provider, you may need three doses over 6 months. Measles, mumps, and rubella (MMR) vaccine  You may need at least one dose of MMR. You may also need a second dose. Meningococcal conjugate (MenACWY) vaccine  One dose is recommended if you are age 19-21 years and a first-year college student living in a residence hall, or if you have one of several medical conditions. You may also need additional booster doses. Pneumococcal conjugate (PCV13) vaccine  You may need  this if you have certain conditions and were not previously vaccinated. Pneumococcal polysaccharide (PPSV23) vaccine  You may need one or two doses if you smoke cigarettes or if you have certain conditions. Hepatitis A vaccine  You may need this if you have certain conditions or if you travel or work in places where you may be exposed to hepatitis A. Hepatitis B vaccine  You may need this if you have certain conditions or if you travel or work in places where you may be exposed to hepatitis B. Haemophilus influenzae type b (Hib) vaccine  You may need this if you have certain conditions. You may receive vaccines as individual doses or as more than one vaccine together in one shot (combination vaccines). Talk with your health care provider about the risks and benefits of combination vaccines. What tests do I need?  Blood tests  Lipid and cholesterol levels. These may be checked every 5 years starting at age 20.  Hepatitis C test.  Hepatitis B test. Screening  Diabetes screening. This is done by checking your blood sugar (glucose) after you have not eaten for a while (fasting).  Sexually transmitted disease (STD) testing.  BRCA-related cancer screening. This may be done if you have a family history of breast, ovarian, tubal, or peritoneal cancers.  Pelvic exam and Pap test. This may be done every 3 years starting at age 21. Starting at age 30, this may be done every 5 years if you have a Pap test in combination with an HPV test. Talk with your health care provider about your test results, treatment options, and if necessary, the need for more   tests. Follow these instructions at home: Eating and drinking   Eat a diet that includes fresh fruits and vegetables, whole grains, lean protein, and low-fat dairy.  Take vitamin and mineral supplements as recommended by your health care provider.  Do not drink alcohol if: ? Your health care provider tells you not to drink. ? You are  pregnant, may be pregnant, or are planning to become pregnant.  If you drink alcohol: ? Limit how much you have to 0-1 drink a day. ? Be aware of how much alcohol is in your drink. In the [place] one drink equals one 12 oz bottle of beer (355 mL), one 5 oz glass of wine (148 mL), or one 1 oz glass of hard liquor (44 mL). Lifestyle  Take daily care of your teeth and gums.  Stay active. Exercise for at least 30 minutes on 5 or more days each week.  Do not use any products that contain nicotine or tobacco, such as cigarettes, e-cigarettes, and chewing tobacco. If you need help quitting, ask your health care provider.  If you are sexually active, practice safe sex. Use a condom or other form of birth control (contraception) in order to prevent pregnancy and STIs (sexually transmitted infections). If you plan to become pregnant, see your health care provider for a preconception visit. What's next?  Visit your health care provider once a year for a well check visit.  Ask your health care provider how often you should have your eyes and teeth checked.  Stay up to date on all vaccines. This information is not intended to replace advice given to you by your health care provider. Make sure you discuss any questions you have with your health care provider. Document Revised: 08/30/2017 Document Reviewed: 08/30/2017 Elsevier Patient Education  2020 [employer]  

## 2019-01-30 NOTE — Progress Notes (Signed)
Subjective:     Melanie Glass is a 39 y.o. female and is here for a comprehensive physical exam. The patient reports no problems.  She has a GYN over at [place] who takes care of her Pap smears.  She does occasionally get some tenderness in the right outer breast that just seems to come and go it seems to be random and not necessarily associated with her.  But she also has an IUD in that she had placed almost 2 years ago.  She thinks she had her Pap smear done around that time but per records at [facility] we do not have the ability to see a copy of that.  She is okay with going ahead and getting her screening mammogram now that she is 40.  She is also due for up-to-date blood work.  She is exercising pretty regularly stress levels are fair at work.  She actually did have Covid back in the fall but says she was fairly asymptomatic except for some drainage.  Social History   Socioeconomic History  . Marital status: Married    Spouse name: Not on file  . Number of children: Not on file  . Years of education: Not on file  . Highest education level: Not on file  Occupational History  . Not on file  Tobacco Use  . Smoking status: Never Smoker  . Smokeless tobacco: Never Used  Substance and Sexual Activity  . Alcohol use: Yes    Comment: occ  . Drug use: No  . Sexual activity: Not on file  Other Topics Concern  . Not on file  Social History Narrative  . Not on file   Social Determinants of Health   Financial Resource Strain:   . Difficulty of Paying Living Expenses: Not on file  Food Insecurity:   . Worried About Charity fundraiser in the Last Year: Not on file  . Ran Out of Food in the Last Year: Not on file  Transportation Needs:   . Lack of Transportation (Medical): Not on file  . Lack of Transportation (Non-Medical): Not on file  Physical Activity:   . Days of Exercise per Week: Not on file  . Minutes of Exercise per Session: Not on file  Stress:   . Feeling of Stress : Not on file   Social Connections:   . Frequency of Communication with Friends and Family: Not on file  . Frequency of Social Gatherings with Friends and Family: Not on file  . Attends Religious Services: Not on file  . Active Member of Clubs or Organizations: Not on file  . Attends Archivist Meetings: Not on file  . Marital Status: Not on file  Intimate Partner Violence:   . Fear of Current or Ex-Partner: Not on file  . Emotionally Abused: Not on file  . Physically Abused: Not on file  . Sexually Abused: Not on file   Health Maintenance  Topic Date Due  . PAP SMEAR-Modifier  04/02/2016  . TETANUS/TDAP  11/18/2023  . INFLUENZA VACCINE  Completed  . HIV Screening  Completed    The following portions of the patient's history were reviewed and updated as appropriate: allergies, current medications, past family history, past medical history, past social history, past surgical history and problem list.  Review of Systems A comprehensive review of systems was negative.   Objective:    BP 116/78   Pulse 70   Ht 5\' 5"  (1.651 m)   Wt 176 lb (79.8  kg)   SpO2 100%   BMI 29.29 kg/m  General appearance: alert, cooperative and appears stated age Head: Normocephalic, without obvious abnormality, atraumatic Eyes: conj clear, EOMI, PEERLA Ears: normal TM's and external ear canals both ears Nose: Nares normal. Septum midline. Mucosa normal. No drainage or sinus tenderness. Throat: lips, mucosa, and tongue normal; teeth and gums normal Neck: no adenopathy, no carotid bruit, no JVD, supple, symmetrical, trachea midline and thyroid not enlarged, symmetric, no tenderness/mass/nodules Back: symmetric, no curvature. ROM normal. No CVA tenderness.  Breast: exam is normal. No mass or lesion. No axillary LNs.  Lungs: clear to auscultation bilaterally Heart: regular rate and rhythm, S1, S2 normal, no murmur, click, rub or gallop Abdomen: soft, non-tender; bowel sounds normal; no masses,  no  organomegaly Extremities: extremities normal, atraumatic, no cyanosis or edema Pulses: 2+ and symmetric Skin: Skin color, texture, turgor normal. No rashes or lesions Lymph nodes: Cervical adenopathy: nl and Supraclavicular adenopathy: nl Neurologic: Alert and oriented X 3, normal strength and tone. Normal symmetric reflexes. Normal coordination and gait    Assessment:    Healthy female exam.      Plan:     See After Visit Summary for Counseling Recommendations    Keep up a regular exercise program and make sure you are eating a healthy diet Try to eat 4 servings of dairy a day, or if you are lactose intolerant take a calcium with vitamin D daily.  Your vaccines are up to date.  Due for labwork.   Will get copy of pa and mammogram.   Chronic congestion/postnasal drip-she did have allergy testing done and definitely has some allergies but would also like ENT consultation just to make sure there is not anything structural going on her polyps etc. that could also be contributing to her symptoms I think it would be worthwhile consultation.  Referral placed today.

## 2019-02-13 LAB — HM PAP SMEAR

## 2019-02-13 LAB — RESULTS CONSOLE HPV: CHL HPV: NEGATIVE

## 2019-04-23 ENCOUNTER — Other Ambulatory Visit: Payer: Self-pay | Admitting: Nurse Practitioner

## 2019-07-11 ENCOUNTER — Ambulatory Visit: Payer: Managed Care, Other (non HMO) | Admitting: Gastroenterology

## 2019-07-11 ENCOUNTER — Encounter: Payer: Self-pay | Admitting: Gastroenterology

## 2019-07-11 VITALS — BP 122/88 | HR 73 | Ht 65.0 in | Wt 174.0 lb

## 2019-07-11 DIAGNOSIS — K219 Gastro-esophageal reflux disease without esophagitis: Secondary | ICD-10-CM

## 2019-07-11 MED ORDER — PANTOPRAZOLE SODIUM 40 MG PO TBEC: DELAYED_RELEASE_TABLET | ORAL | 3 refills | Status: DC

## 2019-07-11 NOTE — Patient Instructions (Signed)
If you are age 40 or older, your body mass index should be between 23-30. Your Body mass index is 28.96 kg/m. If this is out of the aforementioned range listed, please consider follow up with your Primary Care Provider.  If you are age 75 or younger, your body mass index should be between 19-25. Your Body mass index is 28.96 kg/m. If this is out of the aformentioned range listed, please consider follow up with your Primary Care Provider.   Decrease your Protonix 40 mg to 1 to 2 tablets daily. We have sent a new prescription to your pharmacy for you to pick up at your convenience.  Thank you for entrusting me with your care and for choosing [facility], Dr. Mahaffey Cellar

## 2019-07-11 NOTE — Progress Notes (Signed)
HPI :  39 year old female with a history of GERD, here for follow-up visit for this issue.  She has a history of reflux for the past 4 to 5 years that have bothered her.  Typical symptoms are pyrosis, epigastric pressure.  She has been on Protonix 40 mg a day since 2019.  This typically works really well, she does not have much breakthrough on the regimen.  She eats well, no dysphagia.  No routine nausea or vomiting.  Her bowels have been regular, no issues since she was last seen.  She had a colonoscopy for some altered bowel habits in 2019 including some bleeding, it was thought to be due to hemorrhoids at the time.  She denies any family history of esophageal or colon cancer.  She thinks she has been on 20 mg of Protonix in the past but had escalated to 40 mg when she had worsening symptoms at the time.  She inquires about long-term regimen.   Colonoscopy 10/01/2017 - The perianal and digital rectal examinations were normal. - The terminal ileum appeared normal. - Internal hemorrhoids were found during retroflexion. The hemorrhoids were small. - The colon was tortuous. - The exam was otherwise without abnormality. No polyps or inflammatory changes. [place]    Past Medical History:  Diagnosis Date  . Allergy   . Environmental and seasonal allergies   . GERD (gastroesophageal reflux disease)   . Uterine fibroid      Past Surgical History:  Procedure Laterality Date  . INTRAUTERINE DEVICE (IUD) INSERTION     Family History  Problem Relation Age of Onset  . Mental illness Mother   . Hypertension Mother   . Hypertension Father   . Pancreatic cancer Paternal Uncle   . Stomach cancer Neg Hx   . Colon cancer Neg Hx   . Colon polyps Neg Hx   . Esophageal cancer Neg Hx   . Rectal cancer Neg Hx    Social History   Tobacco Use  . Smoking status: Never Smoker  . Smokeless tobacco: Never Used  Vaping Use  . Vaping Use: Never used  Substance Use Topics  .  Alcohol use: Yes    Comment: occ  . Drug use: No   Current Outpatient Medications  Medication Sig Dispense Refill  . fluticasone (FLONASE) 50 MCG/ACT nasal spray Place 1-2 sprays into both nostrils daily. 16 g 6  . levocetirizine (XYZAL) 5 MG tablet Take 5 mg by mouth every evening.    Marland Kitchen levonorgestrel (MIRENA) 20 MCG/24HR IUD 1 each by Intrauterine route once.    . pantoprazole (PROTONIX) 40 MG tablet TAKE 1 TABLET BY MOUTH EVERY DAY IN THE MORNING 90 tablet 0   No current facility-administered medications for this visit.   No Known Allergies   Review of Systems: All systems reviewed and negative except where noted in HPI.   Lab Results  Component Value Date   WBC 2.7 (L) 01/30/2019   HGB 12.8 01/30/2019   HCT 39.2 01/30/2019   MCV 98.7 01/30/2019   PLT 193 01/30/2019    Lab Results  Component Value Date   CREATININE 0.87 01/30/2019   BUN 13 01/30/2019   NA 139 01/30/2019   K 4.2 01/30/2019   CL 105 01/30/2019   CO2 28 01/30/2019     Physical Exam: BP 122/88   Pulse 73   Ht 5\' 5"  (1.651 m)   Wt 174 lb (78.9 kg)   BMI 28.96 kg/m  Constitutional: Pleasant,well-developed, female  in no acute distress. Neurological: Alert and oriented to person place and time. Skin: Skin is warm and dry. No rashes noted. Psychiatric: Normal mood and affect. Behavior is normal.   ASSESSMENT AND PLAN: 39 year old female here for reassessment the following:  GERD - on moderate dose Protonix for the past few years, this appears to be working quite well for her but we discussed long-term regimen.  Her symptoms are very typical for reflux, she has no alarm symptoms, no dysphagia.  I discussed long-term treatment options with her to include H2 blockers, PPIs, TIF and surgery.  I discussed long-term risks of chronic PPI use.  We discussed all of these options and ultimately she would prefer to continue the Protonix, will try to dose reduce her and see how she tolerates this, will dose her  at 20 mg once to twice a day as needed.  If she can taper down to use the lowest dose possible that would be best.  She is not interested in TIF evaluation at this time.  She can follow-up with me once yearly for this issue or sooner with any questions.  Hungerford Cellar, MD [facility]

## 2019-07-22 ENCOUNTER — Other Ambulatory Visit: Payer: Self-pay

## 2019-07-22 DIAGNOSIS — K219 Gastro-esophageal reflux disease without esophagitis: Secondary | ICD-10-CM

## 2019-07-22 MED ORDER — PANTOPRAZOLE SODIUM 20 MG PO TBEC: 20.0000 mg | DELAYED_RELEASE_TABLET | Freq: Every day | ORAL | 3 refills | Status: DC

## 2019-07-22 NOTE — Telephone Encounter (Signed)
RX sent to patients pharmacy

## 2019-12-28 ENCOUNTER — Encounter: Payer: Self-pay | Admitting: Family Medicine

## 2019-12-29 ENCOUNTER — Ambulatory Visit (INDEPENDENT_AMBULATORY_CARE_PROVIDER_SITE_OTHER): Payer: Managed Care, Other (non HMO) | Admitting: Family Medicine

## 2019-12-29 ENCOUNTER — Encounter: Payer: Self-pay | Admitting: Family Medicine

## 2019-12-29 ENCOUNTER — Other Ambulatory Visit: Payer: Self-pay

## 2019-12-29 VITALS — BP 134/92 | HR 94 | Ht 65.0 in | Wt 176.0 lb

## 2019-12-29 DIAGNOSIS — R202 Paresthesia of skin: Secondary | ICD-10-CM

## 2019-12-29 DIAGNOSIS — M549 Dorsalgia, unspecified: Secondary | ICD-10-CM

## 2019-12-29 DIAGNOSIS — R200 Anesthesia of skin: Secondary | ICD-10-CM | POA: Diagnosis not present

## 2019-12-29 MED ORDER — CYCLOBENZAPRINE HCL 10 MG PO TABS: 5.0000 mg | ORAL_TABLET | Freq: Three times a day (TID) | ORAL | 0 refills | Status: DC | PRN

## 2019-12-29 MED ORDER — PREDNISONE 20 MG PO TABS: 40.0000 mg | ORAL_TABLET | Freq: Every day | ORAL | 0 refills | Status: DC

## 2019-12-29 NOTE — Progress Notes (Signed)
Acute Office Visit  Subjective:    Patient ID: Melanie Glass, female    DOB: 04-25-1980, 39 y.o.   MRN: [id number]  Chief Complaint  Patient presents with  . Shoulder Pain    HPI Patient is in today for POSTERIOR LEFT SHOULDER PAIN. PAIN IS 5/10.  Numbness and tingling down her left arm. Tingling in her 3rd, 4th and 5th fingers. Tried IBU and rest.  She denies any prior injury or trauma.  She has been using a ball to press on the area between her spine and shoulder blade on the left upper posterior shoulder.  Not sure if it was really helping.  She has tried ibuprofen and Aleve.  Ibuprofen seems to help some.  The numbness and tingling down the left arm particularly from elbow down into her third fourth and fifth fingers on her left hand has become more persistent.  Just feels like it is constantly trying to fall asleep.  Sometimes if she puts her arm above her head while lying down she might get some temporary relief but not consistently.  She really has not had any neck pain.  Past Medical History:  Diagnosis Date  . Allergy   . Environmental and seasonal allergies   . GERD (gastroesophageal reflux disease)   . Uterine fibroid     Past Surgical History:  Procedure Laterality Date  . INTRAUTERINE DEVICE (IUD) INSERTION      Family History  Problem Relation Age of Onset  . Mental illness Mother   . Hypertension Mother   . Hypertension Father   . Pancreatic cancer Paternal Uncle   . Stomach cancer Neg Hx   . Colon cancer Neg Hx   . Colon polyps Neg Hx   . Esophageal cancer Neg Hx   . Rectal cancer Neg Hx     Social History   Socioeconomic History  . Marital status: Married    Spouse name: Not on file  . Number of children: Not on file  . Years of education: Not on file  . Highest education level: Not on file  Occupational History  . Not on file  Tobacco Use  . Smoking status: Never Smoker  . Smokeless tobacco: Never Used  Vaping Use  . Vaping Use: Never used   Substance and Sexual Activity  . Alcohol use: Yes    Comment: occ  . Drug use: No  . Sexual activity: Not on file  Other Topics Concern  . Not on file  Social History Narrative  . Not on file   Social Determinants of Health   Financial Resource Strain: Not on file  Food Insecurity: Not on file  Transportation Needs: Not on file  Physical Activity: Not on file  Stress: Not on file  Social Connections: Not on file  Intimate Partner Violence: Not on file    Outpatient Medications Prior to Visit  Medication Sig Dispense Refill  . fluticasone (FLONASE) 50 MCG/ACT nasal spray Place 1-2 sprays into both nostrils daily. 16 g 6  . levocetirizine (XYZAL) 5 MG tablet Take 5 mg by mouth every evening.    Marland Kitchen levonorgestrel (MIRENA) 20 MCG/24HR IUD 1 each by Intrauterine route once.    . pantoprazole (PROTONIX) 20 MG tablet Take 1-2 tablets (20-40 mg total) by mouth daily. 180 tablet 3   No facility-administered medications prior to visit.    No Known Allergies  Review of Systems     Objective:    Physical Exam Vitals reviewed.  Constitutional:  Appearance: She is well-developed and well-nourished.  HENT:     Head: Normocephalic and atraumatic.  Eyes:     Extraocular Movements: EOM normal.     Conjunctiva/sclera: Conjunctivae normal.  Cardiovascular:     Rate and Rhythm: Normal rate.  Pulmonary:     Effort: Pulmonary effort is normal.  Musculoskeletal:     Comments: He does have a palpable knot in the muscles between the upper mid back spine and the scapula.  Nontender over the scapula or shoulder.  Mildly tender posterior to the medial epicondyle on the left elbow.  Left wrist with normal range of motion.  She has slightly decreased strength in her fourth and fifth fingers on that left hand, compared to her right.  Skin:    General: Skin is dry.     Coloration: Skin is not pale.  Neurological:     Mental Status: She is alert and oriented to person, place, and time.   Psychiatric:        Mood and Affect: Mood and affect normal.        Behavior: Behavior normal.     BP (!) 134/92   Pulse 94   Ht 5\' 5"  (1.651 m)   Wt 176 lb (79.8 kg)   SpO2 100%   BMI 29.29 kg/m  Wt Readings from Last 3 Encounters:  12/29/19 176 lb (79.8 kg)  07/11/19 174 lb (78.9 kg)  01/30/19 176 lb (79.8 kg)    Health Maintenance Due  Topic Date Due  . Hepatitis C Screening  Never done  . PAP SMEAR-Modifier  04/02/2016    There are no preventive care reminders to display for this patient.   Lab Results  Component Value Date   TSH 0.86 11/02/2015   Lab Results  Component Value Date   WBC 2.7 (L) 01/30/2019   HGB 12.8 01/30/2019   HCT 39.2 01/30/2019   MCV 98.7 01/30/2019   PLT 193 01/30/2019   Lab Results  Component Value Date   NA 139 01/30/2019   K 4.2 01/30/2019   CO2 28 01/30/2019   GLUCOSE 87 01/30/2019   BUN 13 01/30/2019   CREATININE 0.87 01/30/2019   BILITOT 0.6 01/30/2019   ALKPHOS 38 07/03/2016   AST 17 01/30/2019   ALT 14 01/30/2019   PROT 6.5 01/30/2019   ALBUMIN 4.0 07/03/2016   CALCIUM 9.3 01/30/2019   Lab Results  Component Value Date   CHOL 211 (H) 01/30/2019   Lab Results  Component Value Date   HDL 78 01/30/2019   Lab Results  Component Value Date   LDLCALC 123 (H) 01/30/2019   Lab Results  Component Value Date   TRIG 34 01/30/2019   Lab Results  Component Value Date   CHOLHDL 2.7 01/30/2019   No results found for: HGBA1C     Assessment & Plan:   Problem List Items Addressed This Visit   None   Visit Diagnoses    Numbness and tingling in left arm    -  Primary   Relevant Orders   Ambulatory referral to Orthopedic Surgery   Upper back pain on left side       Relevant Medications   predniSONE (DELTASONE) 20 MG tablet   cyclobenzaprine (FLEXERIL) 10 MG tablet     Numbness and tingling of left arm most consistent with on our neuropathy/entrapment.  We discussed trial of prednisone and referral to the  orthopedist for further evaluation she is really not having a lot of neck discomfort  so I am concerned that it could be entrapped at the elbow.  Hopefully she will get some improvement with the prednisone.  Referral placed.  Left upper back pain-work on gentle stretches compression with a rubber ball or tennis ball and a trial of muscle relaxer.  Meds ordered this encounter  Medications  . predniSONE (DELTASONE) 20 MG tablet    Sig: Take 2 tablets (40 mg total) by mouth daily with breakfast.    Dispense:  10 tablet    Refill:  0  . cyclobenzaprine (FLEXERIL) 10 MG tablet    Sig: Take 0.5-1 tablets (5-10 mg total) by mouth 3 (three) times daily as needed for muscle spasms.    Dispense:  30 tablet    Refill:  0     Beatrice Lecher, MD

## 2020-01-15 ENCOUNTER — Ambulatory Visit: Payer: Managed Care, Other (non HMO) | Admitting: Orthopedic Surgery

## 2020-03-05 ENCOUNTER — Other Ambulatory Visit: Payer: Self-pay | Admitting: Family Medicine

## 2020-03-05 DIAGNOSIS — Z1231 Encounter for screening mammogram for malignant neoplasm of breast: Secondary | ICD-10-CM

## 2020-03-12 ENCOUNTER — Ambulatory Visit: Payer: Managed Care, Other (non HMO)

## 2020-03-19 ENCOUNTER — Ambulatory Visit (INDEPENDENT_AMBULATORY_CARE_PROVIDER_SITE_OTHER): Payer: Managed Care, Other (non HMO)

## 2020-03-19 ENCOUNTER — Other Ambulatory Visit: Payer: Self-pay

## 2020-03-19 DIAGNOSIS — Z1231 Encounter for screening mammogram for malignant neoplasm of breast: Secondary | ICD-10-CM | POA: Diagnosis not present

## 2020-03-24 ENCOUNTER — Ambulatory Visit (INDEPENDENT_AMBULATORY_CARE_PROVIDER_SITE_OTHER): Payer: Managed Care, Other (non HMO) | Admitting: Family Medicine

## 2020-03-24 ENCOUNTER — Other Ambulatory Visit: Payer: Self-pay

## 2020-03-24 ENCOUNTER — Encounter: Payer: Self-pay | Admitting: Family Medicine

## 2020-03-24 VITALS — BP 122/82 | HR 92 | Ht 65.0 in | Wt 173.0 lb

## 2020-03-24 DIAGNOSIS — Z1159 Encounter for screening for other viral diseases: Secondary | ICD-10-CM

## 2020-03-24 DIAGNOSIS — Z0000 Encounter for general adult medical examination without abnormal findings: Secondary | ICD-10-CM

## 2020-03-24 DIAGNOSIS — L918 Other hypertrophic disorders of the skin: Secondary | ICD-10-CM | POA: Diagnosis not present

## 2020-03-24 NOTE — Progress Notes (Signed)
Subjective:     Melanie Glass is a 40 y.o. female and is here for a comprehensive physical exam. The patient reports no problems.  She says she is exercising a little but has not done a lot ever since her shoulder injury.  She is getting a little better in that regard.  She reports her diet is fair.  Her sleep is also been better since her shoulder has improved.  She had her Covid booster on December 3.  She says her mom was recently diagnosed with sleep apnea and now is using CPAP.  She herself occasionally snores but not consistently.  Does not wake up with morning headaches.  Social History   Socioeconomic History  . Marital status: Married    Spouse name: Not on file  . Number of children: Not on file  . Years of education: Not on file  . Highest education level: Not on file  Occupational History  . Not on file  Tobacco Use  . Smoking status: Never Smoker  . Smokeless tobacco: Never Used  Vaping Use  . Vaping Use: Never used  Substance and Sexual Activity  . Alcohol use: Yes    Comment: occ  . Drug use: No  . Sexual activity: Not on file  Other Topics Concern  . Not on file  Social History Narrative  . Not on file   Social Determinants of Health   Financial Resource Strain: Not on file  Food Insecurity: Not on file  Transportation Needs: Not on file  Physical Activity: Not on file  Stress: Not on file  Social Connections: Not on file  Intimate Partner Violence: Not on file   Health Maintenance  Topic Date Due  . Hepatitis C Screening  Never done  . PAP SMEAR-Modifier  04/02/2016  . TETANUS/TDAP  11/18/2023  . INFLUENZA VACCINE  Completed  . COVID-19 Vaccine  Completed  . HIV Screening  Completed  . HPV VACCINES  Aged Out    The following portions of the patient's history were reviewed and updated as appropriate: allergies, current medications, past family history, past medical history, past social history, past surgical history and problem list.  Review of  Systems A comprehensive review of systems was negative.   Objective:    BP 122/82   Pulse 92   Ht 5\' 5"  (1.651 m)   Wt 173 lb (78.5 kg)   LMP 03/11/2020   SpO2 100%   BMI 28.79 kg/m  General appearance: alert, cooperative and appears stated age Head: Normocephalic, without obvious abnormality, atraumatic Eyes: conj clear, EOMI, PPERLA Ears: normal TM's and external ear canals both ears Nose: Nares normal. Septum midline. Mucosa normal. No drainage or sinus tenderness. Throat: lips, mucosa, and tongue normal; teeth and gums normal Neck: no adenopathy, no carotid bruit, no JVD, supple, symmetrical, trachea midline and thyroid not enlarged, symmetric, no tenderness/mass/nodules Back: symmetric, no curvature. ROM normal. No CVA tenderness. Lungs: clear to auscultation bilaterally Heart: regular rate and rhythm, S1, S2 normal, no murmur, click, rub or gallop Abdomen: soft, non-tender; bowel sounds normal; no masses,  no organomegaly Extremities: extremities normal, atraumatic, no cyanosis or edema Pulses: 2+ and symmetric Skin: Skin color, texture, turgor normal. No rashes or lesions Lymph nodes: Cervical adenopathy: nl and Supraclavicular adenopathy: nl Neurologic: Alert and oriented X 3, normal strength and tone. Normal symmetric reflexes. Normal coordination and gait    Assessment:    Healthy female exam.     Plan:     See After  Visit Summary for Counseling Recommendations   Keep up a regular exercise program and make sure you are eating a healthy diet Try to eat 4 servings of dairy a day, or if you are lactose intolerant take a calcium with vitamin D daily.  Your vaccines are up to date.   She does have a skin tag between the buttock area that she would like to eventually have removed we discussed that process and encouraged her to schedule at her convenience.

## 2020-03-24 NOTE — Progress Notes (Signed)
Pt reports that she had a Pap done last year w/GYN

## 2020-03-24 NOTE — Patient Instructions (Signed)
Health Maintenance, Female Adopting a healthy lifestyle and getting preventive care are important in promoting health and wellness. Ask your health care provider about:  The right schedule for you to have regular tests and exams.  Things you can do on your own to prevent diseases and keep yourself healthy. What should I know about diet, weight, and exercise? Eat a healthy diet  Eat a diet that includes plenty of vegetables, fruits, low-fat dairy products, and lean protein.  Do not eat a lot of foods that are high in solid fats, added sugars, or sodium.   Maintain a healthy weight Body mass index (BMI) is used to identify weight problems. It estimates body fat based on height and weight. Your health care provider can help determine your BMI and help you achieve or maintain a healthy weight. Get regular exercise Get regular exercise. This is one of the most important things you can do for your health. Most adults should:  Exercise for at least 150 minutes each week. The exercise should increase your heart rate and make you sweat (moderate-intensity exercise).  Do strengthening exercises at least twice a week. This is in addition to the moderate-intensity exercise.  Spend less time sitting. Even light physical activity can be beneficial. Watch cholesterol and blood lipids Have your blood tested for lipids and cholesterol at 41 years of age, then have this test every 5 years. Have your cholesterol levels checked more often if:  Your lipid or cholesterol levels are high.  You are older than 40 years of age.  You are at high risk for heart disease. What should I know about cancer screening? Depending on your health history and family history, you may need to have cancer screening at various ages. This may include screening for:  Breast cancer.  Cervical cancer.  Colorectal cancer.  Skin cancer.  Lung cancer. What should I know about heart disease, diabetes, and high blood  pressure? Blood pressure and heart disease  High blood pressure causes heart disease and increases the risk of stroke. This is more likely to develop in people who have high blood pressure readings, are of African descent, or are overweight.  Have your blood pressure checked: ? Every 3-5 years if you are 18-39 years of age. ? Every year if you are 40 years old or older. Diabetes Have regular diabetes screenings. This checks your fasting blood sugar level. Have the screening done:  Once every three years after age 40 if you are at a normal weight and have a low risk for diabetes.  More often and at a younger age if you are overweight or have a high risk for diabetes. What should I know about preventing infection? Hepatitis B If you have a higher risk for hepatitis B, you should be screened for this virus. Talk with your health care provider to find out if you are at risk for hepatitis B infection. Hepatitis C Testing is recommended for:  Everyone born from 1945 through 1965.  Anyone with known risk factors for hepatitis C. Sexually transmitted infections (STIs)  Get screened for STIs, including gonorrhea and chlamydia, if: ? You are sexually active and are younger than 41 years of age. ? You are older than 41 years of age and your health care provider tells you that you are at risk for this type of infection. ? Your sexual activity has changed since you were last screened, and you are at increased risk for chlamydia or gonorrhea. Ask your health care provider   if you are at risk.  Ask your health care provider about whether you are at high risk for HIV. Your health care provider may recommend a prescription medicine to help prevent HIV infection. If you choose to take medicine to prevent HIV, you should first get tested for HIV. You should then be tested every 3 months for as long as you are taking the medicine. Pregnancy  If you are about to stop having your period (premenopausal) and  you may become pregnant, seek counseling before you get pregnant.  Take 400 to 800 micrograms (mcg) of folic acid every day if you become pregnant.  Ask for birth control (contraception) if you want to prevent pregnancy. Osteoporosis and menopause Osteoporosis is a disease in which the bones lose minerals and strength with aging. This can result in bone fractures. If you are 65 years old or older, or if you are at risk for osteoporosis and fractures, ask your health care provider if you should:  Be screened for bone loss.  Take a calcium or vitamin D supplement to lower your risk of fractures.  Be given hormone replacement therapy (HRT) to treat symptoms of menopause. Follow these instructions at home: Lifestyle  Do not use any products that contain nicotine or tobacco, such as cigarettes, e-cigarettes, and chewing tobacco. If you need help quitting, ask your health care provider.  Do not use street drugs.  Do not share needles.  Ask your health care provider for help if you need support or information about quitting drugs. Alcohol use  Do not drink alcohol if: ? Your health care provider tells you not to drink. ? You are pregnant, may be pregnant, or are planning to become pregnant.  If you drink alcohol: ? Limit how much you use to 0-1 drink a day. ? Limit intake if you are breastfeeding.  Be aware of how much alcohol is in your drink. In the [place] one drink equals one 12 oz bottle of beer (355 mL), one 5 oz glass of wine (148 mL), or one 1 oz glass of hard liquor (44 mL). General instructions  Schedule regular health, dental, and eye exams.  Stay current with your vaccines.  Tell your health care provider if: ? You often feel depressed. ? You have ever been abused or do not feel safe at home. Summary  Adopting a healthy lifestyle and getting preventive care are important in promoting health and wellness.  Follow your health care provider's instructions about healthy  diet, exercising, and getting tested or screened for diseases.  Follow your health care provider's instructions on monitoring your cholesterol and blood pressure. This information is not intended to replace advice given to you by your health care provider. Make sure you discuss any questions you have with your health care provider. Document Revised: 12/12/2017 Document Reviewed: 12/12/2017 Elsevier Patient Education  2021 Elsevier Inc.  

## 2020-03-25 LAB — COMPLETE METABOLIC PANEL WITH GFR
AG Ratio: 1.8 (calc) (ref 1.0–2.5)
ALT: 13 U/L (ref 6–29)
AST: 16 U/L (ref 10–30)
Albumin: 4.4 g/dL (ref 3.6–5.1)
Alkaline phosphatase (APISO): 36 U/L (ref 31–125)
BUN: 13 mg/dL (ref 7–25)
CO2: 28 mmol/L (ref 20–32)
Calcium: 9.5 mg/dL (ref 8.6–10.2)
Chloride: 103 mmol/L (ref 98–110)
Creat: 0.92 mg/dL (ref 0.50–1.10)
GFR, Est African American: 90 mL/min/{1.73_m2} (ref >=60)
GFR, Est Non African American: 77 mL/min/{1.73_m2} (ref >=60)
Globulin: 2.5 g/dL (calc) (ref 1.9–3.7)
Glucose, Bld: 84 mg/dL (ref 65–99)
Potassium: 4.2 mmol/L (ref 3.5–5.3)
Sodium: 138 mmol/L (ref 135–146)
Total Bilirubin: 0.5 mg/dL (ref 0.2–1.2)
Total Protein: 6.9 g/dL (ref 6.1–8.1)

## 2020-03-25 LAB — CBC
HCT: 41 % (ref 35.0–45.0)
Hemoglobin: 13.6 g/dL (ref 11.7–15.5)
MCH: 33.1 pg — ABNORMAL HIGH (ref 27.0–33.0)
MCHC: 33.2 g/dL (ref 32.0–36.0)
MCV: 99.8 fL (ref 80.0–100.0)
MPV: 10.7 fL (ref 7.5–12.5)
Platelets: 186 10*3/uL (ref 140–400)
RBC: 4.11 10*6/uL (ref 3.80–5.10)
RDW: 12.3 % (ref 11.0–15.0)
WBC: 2.6 10*3/uL — ABNORMAL LOW (ref 3.8–10.8)

## 2020-03-25 LAB — LIPID PANEL W/REFLEX DIRECT LDL
Cholesterol: 211 mg/dL — ABNORMAL HIGH (ref <200)
HDL: 79 mg/dL (ref >=50)
LDL Cholesterol (Calc): 119 mg/dL (calc) — ABNORMAL HIGH
Non-HDL Cholesterol (Calc): 132 mg/dL (calc) — ABNORMAL HIGH (ref <130)
Total CHOL/HDL Ratio: 2.7 (calc) (ref <5.0)
Triglycerides: 43 mg/dL (ref <150)

## 2020-03-25 LAB — HEPATITIS C ANTIBODY
Hepatitis C Ab: NONREACTIVE
SIGNAL TO CUT-OFF: 0 (ref <1.00)

## 2020-03-26 ENCOUNTER — Other Ambulatory Visit: Payer: Self-pay | Admitting: *Deleted

## 2020-03-26 DIAGNOSIS — R899 Unspecified abnormal finding in specimens from other organs, systems and tissues: Secondary | ICD-10-CM

## 2020-07-15 IMAGING — MG DIGITAL SCREENING BILAT W/ TOMO W/ CAD
6 of 10 series · 6 of 30 positions shown · non-contrast
Comparison: None.

CLINICAL DATA: Screening.

EXAM:
DIGITAL SCREENING BILATERAL MAMMOGRAM WITH TOMO AND CAD

[R MLO synth-2D]
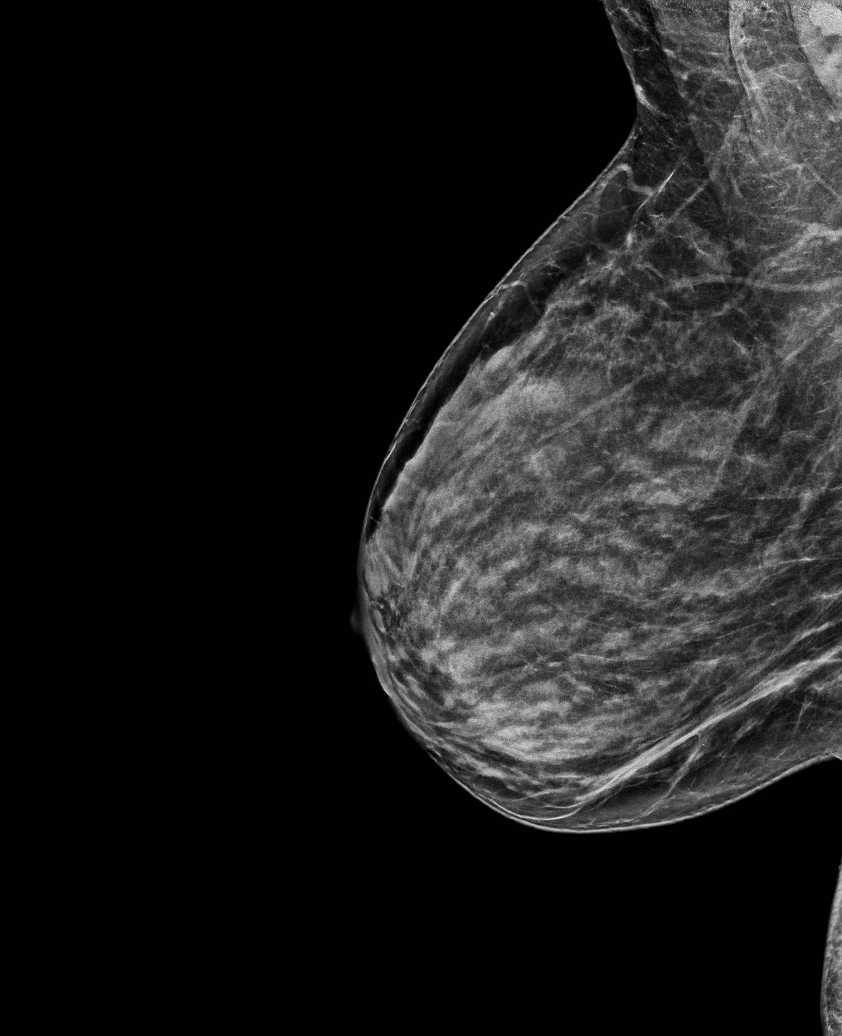

[L CC synth-2D]
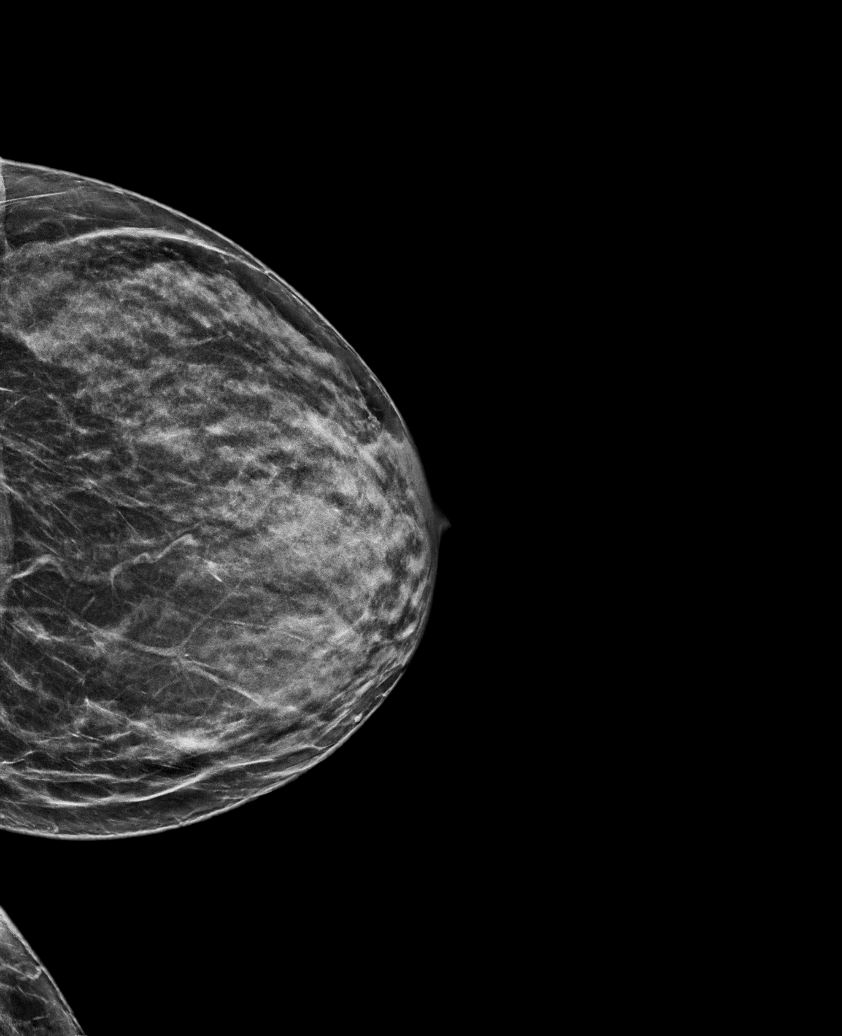

[L MLO synth-2D (1 of 2)]
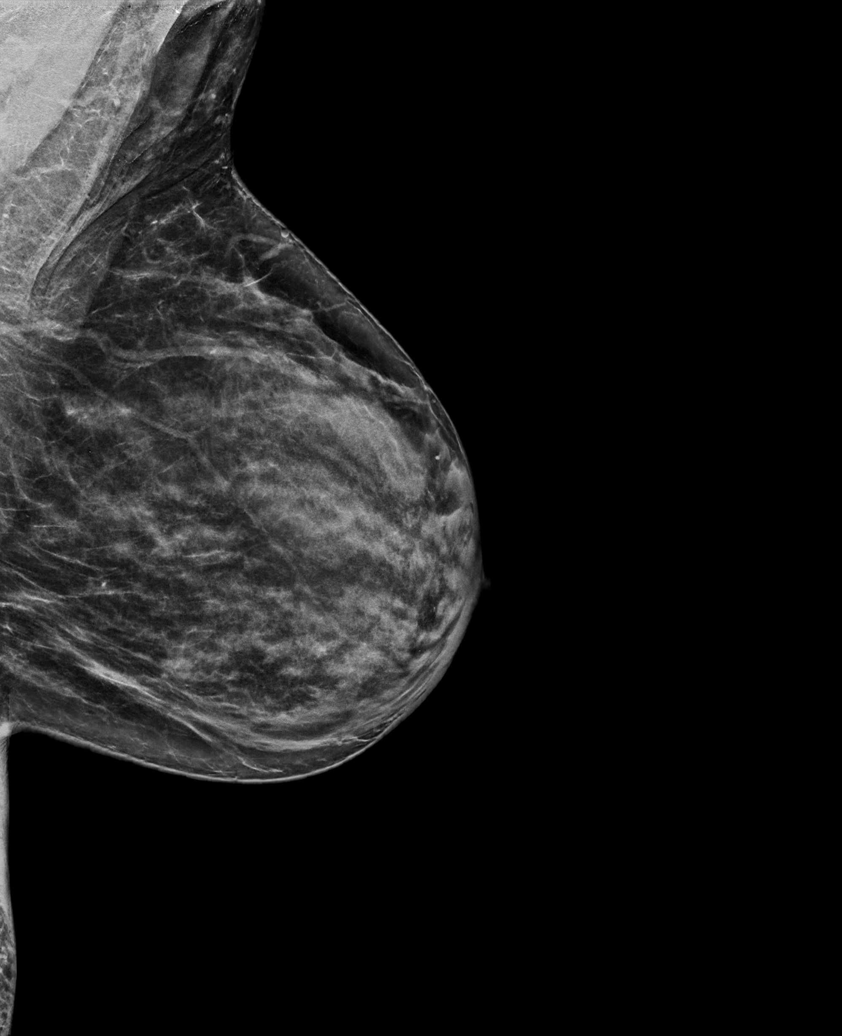

[L MLO synth-2D (2 of 2)]
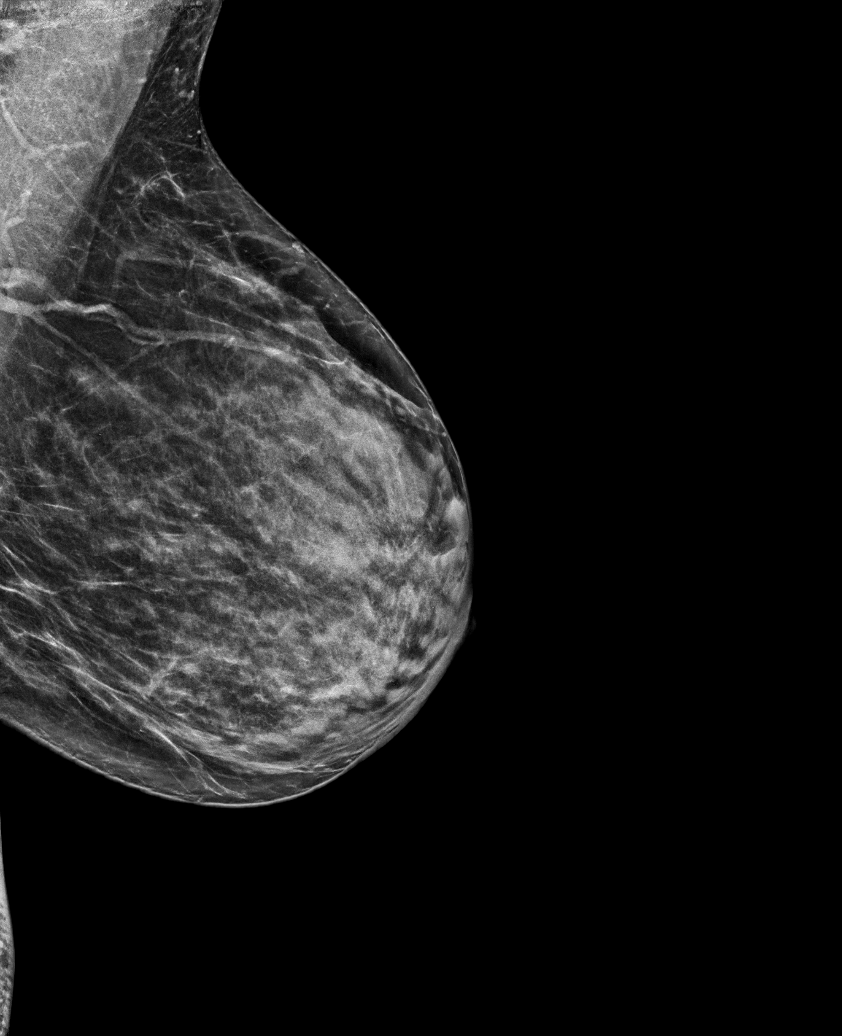

[R CC synth-2D]
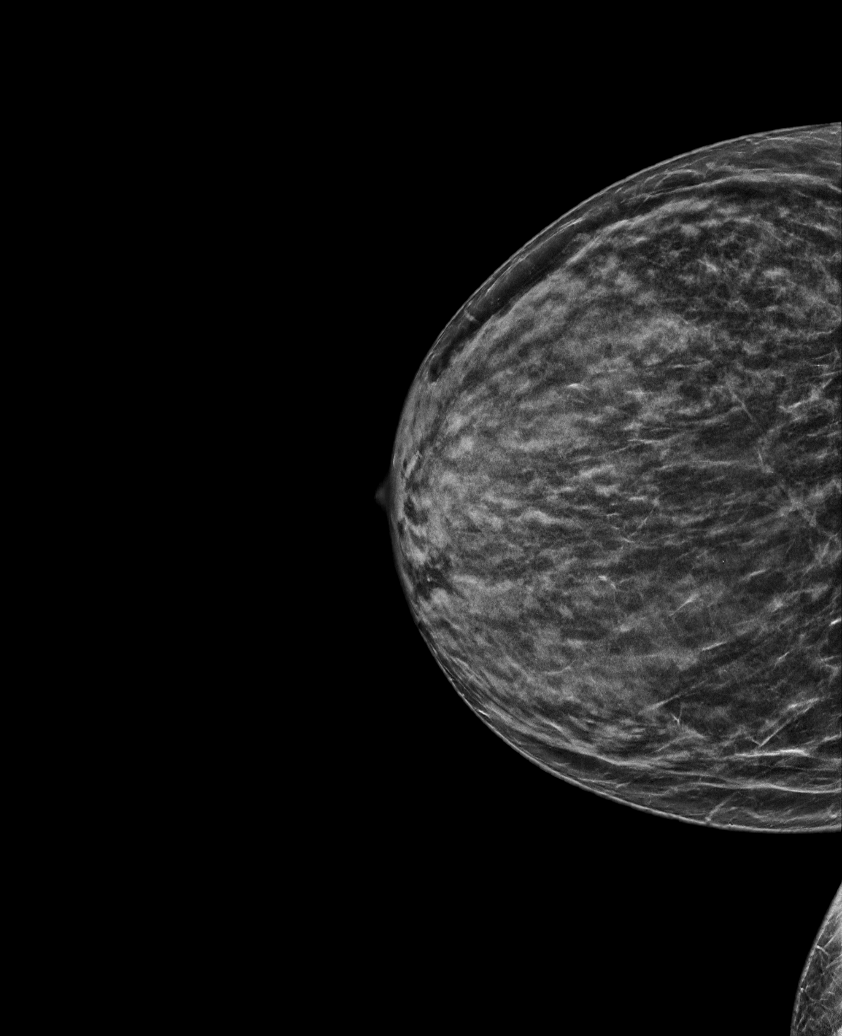

[R CC tomo · tomo slice 29/56.0]
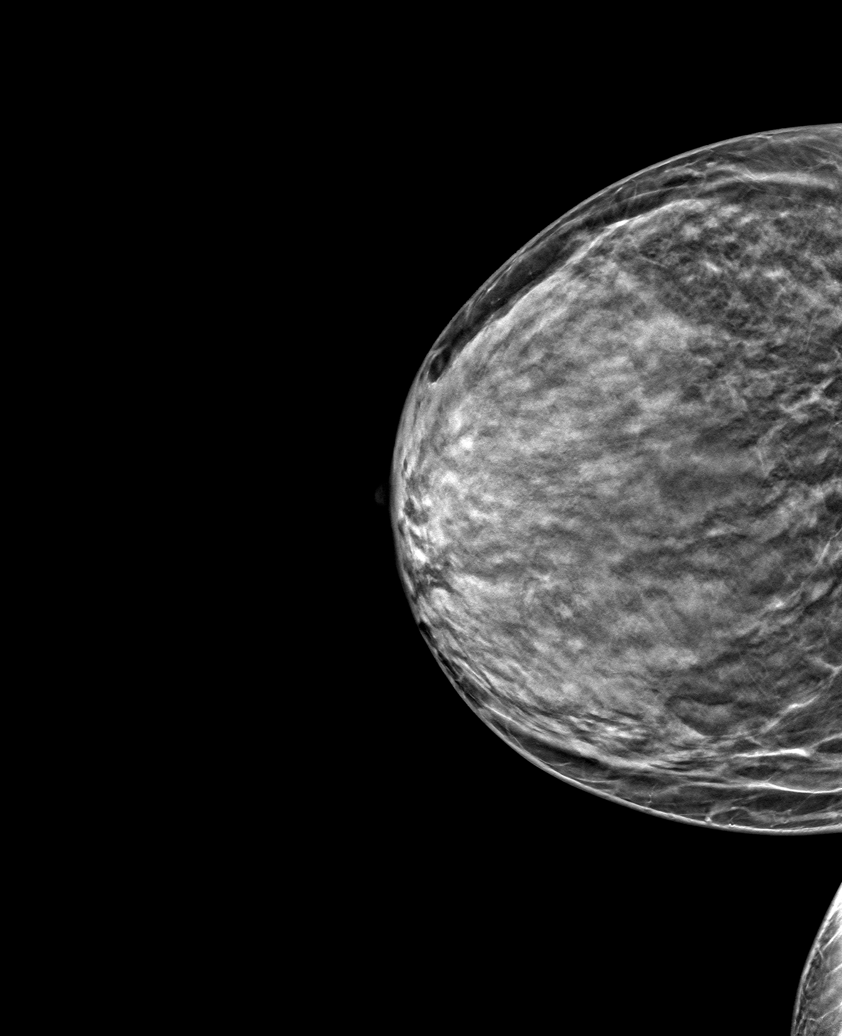

[6 of 30 positions shown; findings below may reference images not displayed]

ACR Breast Density Category c: The breast tissue is heterogeneously
dense, which may obscure small masses
FINDINGS: There are no findings suspicious for malignancy. Images were
processed with CAD.
IMPRESSION: No mammographic evidence of malignancy. A result letter of this
screening mammogram will be mailed directly to the patient.

RECOMMENDATION:
Screening mammogram in one year. (Code:EM-[id number])

BI-RADS CATEGORY  1: Negative.

## 2020-07-28 ENCOUNTER — Other Ambulatory Visit: Payer: Self-pay | Admitting: Gastroenterology

## 2020-07-28 DIAGNOSIS — K219 Gastro-esophageal reflux disease without esophagitis: Secondary | ICD-10-CM

## 2020-08-21 ENCOUNTER — Other Ambulatory Visit: Payer: Self-pay | Admitting: Gastroenterology

## 2020-08-21 DIAGNOSIS — K219 Gastro-esophageal reflux disease without esophagitis: Secondary | ICD-10-CM

## 2020-09-17 ENCOUNTER — Other Ambulatory Visit: Payer: Self-pay | Admitting: Gastroenterology

## 2020-09-17 DIAGNOSIS — K219 Gastro-esophageal reflux disease without esophagitis: Secondary | ICD-10-CM

## 2020-10-08 ENCOUNTER — Encounter: Payer: Self-pay | Admitting: Family Medicine

## 2020-10-08 NOTE — Progress Notes (Signed)
abstrac

## 2020-10-19 ENCOUNTER — Telehealth: Payer: Self-pay | Admitting: Gastroenterology

## 2020-10-19 DIAGNOSIS — K219 Gastro-esophageal reflux disease without esophagitis: Secondary | ICD-10-CM

## 2020-10-19 MED ORDER — PANTOPRAZOLE SODIUM 20 MG PO TBEC: 20.0000 mg | DELAYED_RELEASE_TABLET | Freq: Every day | ORAL | 1 refills | Status: DC

## 2020-10-19 NOTE — Telephone Encounter (Signed)
Patient needs a refill for Pantoprazole.  It was written for 1-2 a day as needed, and patient says she normally takes the 2 a day.  She was told she needed an OV before getting another refill.  She has an appointment scheduled for 11/28 with Dr. Havery Moros.  Are we able to call in enough for her until her appointment?  I told her there was no guarantee, but I'd ask.  Thank you.

## 2020-10-19 NOTE — Telephone Encounter (Signed)
Script sent to get to 11-28 appointment.

## 2020-11-17 ENCOUNTER — Other Ambulatory Visit: Payer: Self-pay | Admitting: Gastroenterology

## 2020-11-17 DIAGNOSIS — K219 Gastro-esophageal reflux disease without esophagitis: Secondary | ICD-10-CM

## 2020-11-28 ENCOUNTER — Encounter: Payer: Self-pay | Admitting: Emergency Medicine

## 2020-11-28 ENCOUNTER — Emergency Department
Admission: EM | Admit: 2020-11-28 | Discharge: 2020-11-28 | Disposition: A | Discharge status: Home / Self Care | Payer: Managed Care, Other (non HMO) | Source: Home / Self Care | Attending: Family Medicine | Admitting: Family Medicine

## 2020-11-28 DIAGNOSIS — J069 Acute upper respiratory infection, unspecified: Secondary | ICD-10-CM | POA: Diagnosis not present

## 2020-11-28 DIAGNOSIS — R030 Elevated blood-pressure reading, without diagnosis of hypertension: Secondary | ICD-10-CM

## 2020-11-28 MED ORDER — BENZONATATE 200 MG PO CAPS: 200.0000 mg | ORAL_CAPSULE | Freq: Three times a day (TID) | ORAL | 0 refills | Status: DC | PRN

## 2020-11-28 MED ORDER — FLUTICASONE PROPIONATE 50 MCG/ACT NA SUSP: 2.0000 | Freq: Every day | NASAL | 0 refills | Status: DC

## 2020-11-28 MED ORDER — AMOXICILLIN 875 MG PO TABS: 875.0000 mg | ORAL_TABLET | Freq: Two times a day (BID) | ORAL | 0 refills | Status: DC

## 2020-11-28 NOTE — ED Provider Notes (Signed)
[name]    CSN: [id number] Arrival date & time: 11/28/20  1004      History   Chief Complaint Chief Complaint  Patient presents with   Cough    HPI Melanie Glass is a 40 y.o. female.   HPI Patient has been sick for over a week.  She does have allergies but does not think this is allergies.  She has had cough and runny nose sinus pressure and pain postnasal drip for over a week.  She has tried over-the-counter medication.  She has had flu vaccine.  She has had COVID-vaccine.  No one else in her household is sick.  She does work in [employer].  No known exposure to flu or COVID.  Biggest concern at this time is persistent cough  Past Medical History:  Diagnosis Date   Allergy    Environmental and seasonal allergies    GERD (gastroesophageal reflux disease)    Uterine fibroid     Patient Active Problem List   Diagnosis Date Noted   DERMATOFIBROMA 06/15/2008   HEMORRHOIDS, NOS 10/10/2005   GASTROESOPHAGEAL REFLUX, NO ESOPHAGITIS 10/10/2005    Past Surgical History:  Procedure Laterality Date   INTRAUTERINE DEVICE (IUD) INSERTION      OB History   No obstetric history on file.      Home Medications    Prior to Admission medications   Medication Sig Start Date End Date Taking? Authorizing Provider  amoxicillin (AMOXIL) 875 MG tablet Take 1 tablet (875 mg total) by mouth 2 (two) times daily. 11/28/20  Yes Raylene Everts, MD  benzonatate (TESSALON) 200 MG capsule Take 1 capsule (200 mg total) by mouth 3 (three) times daily as needed for cough. 11/28/20  Yes Raylene Everts, MD  fluticasone [facility]) 50 MCG/ACT nasal spray Place 2 sprays into both nostrils daily. 11/28/20  Yes Raylene Everts, MD  ibuprofen (ADVIL) 800 MG tablet Take 800 mg by mouth every 8 (eight) hours as needed. 02/29/20   [provider]  levocetirizine (XYZAL) 5 MG tablet Take 5 mg by mouth every evening.    [provider]  levonorgestrel (MIRENA) 20  MCG/24HR IUD 1 each by Intrauterine route once.    [provider]  pantoprazole (PROTONIX) 20 MG tablet Take 1-2 tablets (20-40 mg total) by mouth daily. Please keep your November appointment for further refills. 11/17/20   Armbruster, Carlota Raspberry, MD    Family History Family History  Problem Relation Age of Onset   Mental illness Mother    Hypertension Mother    Sleep apnea Mother    Hypertension Father    Sleep apnea Father    Pancreatic cancer Paternal Uncle    Stomach cancer Neg Hx    Colon cancer Neg Hx    Colon polyps Neg Hx    Esophageal cancer Neg Hx    Rectal cancer Neg Hx     Social History Social History   Tobacco Use   Smoking status: Never   Smokeless tobacco: Never  Vaping Use   Vaping Use: Never used  Substance Use Topics   Alcohol use: Yes    Comment: occ   Drug use: No     Allergies   Patient has no known allergies.   Review of Systems Review of Systems See HPI  Physical Exam Triage Vital Signs ED Triage Vitals  Enc Vitals Group     BP 11/28/20 1014 (!) 148/101     Pulse Rate 11/28/20 1014 88  Resp 11/28/20 1014 15     Temp 11/28/20 1014 99.6 F (37.6 C)     Temp Source 11/28/20 1014 Oral     SpO2 11/28/20 1014 98 %     Weight 11/28/20 1017 175 lb (79.4 kg)     Height 11/28/20 1017 5\' 5"  (1.651 m)     Head Circumference --      Peak Flow --      Pain Score 11/28/20 1017 0     Pain Loc --      Pain Edu? --      Excl. in [place]? --    No data found.  Updated Vital Signs BP (!) 140/100 (BP Location: Right Arm) Comment (BP Location): pt took robitussin at 0300 & 0900 today  Pulse 88   Temp 99.6 F (37.6 C) (Oral)   Resp 15   Ht 5\' 5"  (1.651 m)   Wt 79.4 kg   SpO2 98%   BMI 29.12 kg/m      Physical Exam Constitutional:      General: She is not in acute distress.    Appearance: Normal appearance. She is well-developed and normal weight.  HENT:     Head: Normocephalic and atraumatic.     Right Ear: Tympanic membrane,  ear canal and external ear normal.     Left Ear: Tympanic membrane, ear canal and external ear normal.     Nose: Congestion present. No rhinorrhea.     Mouth/Throat:     Mouth: Mucous membranes are moist.     Pharynx: No posterior oropharyngeal erythema.     Comments: Facial sinuses are tender Eyes:     Conjunctiva/sclera: Conjunctivae normal.     Pupils: Pupils are equal, round, and reactive to light.  Cardiovascular:     Rate and Rhythm: Normal rate and regular rhythm.     Heart sounds: Normal heart sounds.  Pulmonary:     Effort: Pulmonary effort is normal. No respiratory distress.     Breath sounds: Normal breath sounds. No wheezing or rales.  Abdominal:     General: There is no distension.     Palpations: Abdomen is soft.  Musculoskeletal:        General: Normal range of motion.     Cervical back: Normal range of motion.  Lymphadenopathy:     Cervical: No cervical adenopathy.  Skin:    General: Skin is warm and dry.  Neurological:     Mental Status: She is alert.  Psychiatric:        Mood and Affect: Mood normal.        Behavior: Behavior normal.     UC Treatments / Results  Labs (all labs ordered are listed, but only abnormal results are displayed) Labs Reviewed - No data to display  EKG   Radiology No results found.  Procedures Procedures (including critical care time)  Medications Ordered in UC Medications - No data to display  Initial Impression / Assessment and Plan / UC Course  I have reviewed the triage vital signs and the nursing notes.  Pertinent labs & imaging results that were available during my care of the patient were reviewed by me and considered in my medical decision making (see chart for details).     Final Clinical Impressions(s) / UC Diagnoses   Final diagnoses:  Viral URI with cough     Discharge Instructions      Drink lots of fluids Take amoxicillin 2 times a day for a week Use  Flonase as directed.  Use 2 times a day  for the first 2 to 3 days then down to once a day until symptoms improve Take Tessalon for cough.  Take 2-3 times a day.  May take Robitussin in addition if needed Call your doctor if not improving by next week   ED Prescriptions     Medication Sig Dispense Auth. Provider   benzonatate (TESSALON) 200 MG capsule Take 1 capsule (200 mg total) by mouth 3 (three) times daily as needed for cough. 21 capsule Raylene Everts, MD   fluticasone [facility]) 50 MCG/ACT nasal spray Place 2 sprays into both nostrils daily. 16 g Raylene Everts, MD   amoxicillin (AMOXIL) 875 MG tablet Take 1 tablet (875 mg total) by mouth 2 (two) times daily. 14 tablet Raylene Everts, MD      PDMP not reviewed this encounter.   Raylene Everts, MD 11/28/20 1052

## 2020-11-28 NOTE — Discharge Instructions (Addendum)
Drink lots of fluids Take amoxicillin 2 times a day for a week Use Flonase as directed.  Use 2 times a day for the first 2 to 3 days then down to once a day until symptoms improve Take Tessalon for cough.  Take 2-3 times a day.  May take Robitussin in addition if needed Keep track of the elevated blood pressure Call your doctor if not improving by next week

## 2020-11-28 NOTE — ED Triage Notes (Signed)
Cough x 1 week w/ a runny nose  Denies fever  Had a flu vaccine  OTC - robitussin today - seems to help the most Also OTC tea w/ honey, humidifier, dayquil & nyquil, mucinex sinus max on various days thr out the week

## 2020-11-29 ENCOUNTER — Ambulatory Visit: Payer: Managed Care, Other (non HMO) | Admitting: Gastroenterology

## 2020-12-10 ENCOUNTER — Other Ambulatory Visit: Payer: Self-pay | Admitting: Gastroenterology

## 2020-12-10 DIAGNOSIS — K219 Gastro-esophageal reflux disease without esophagitis: Secondary | ICD-10-CM

## 2020-12-24 ENCOUNTER — Other Ambulatory Visit: Payer: Self-pay | Admitting: Gastroenterology

## 2020-12-24 DIAGNOSIS — K219 Gastro-esophageal reflux disease without esophagitis: Secondary | ICD-10-CM

## 2021-01-07 ENCOUNTER — Emergency Department
Admission: EM | Admit: 2021-01-07 | Discharge: 2021-01-07 | Disposition: A | Discharge status: Home / Self Care | Payer: Managed Care, Other (non HMO) | Attending: Family Medicine | Admitting: Family Medicine

## 2021-01-07 ENCOUNTER — Emergency Department (INDEPENDENT_AMBULATORY_CARE_PROVIDER_SITE_OTHER): Payer: Managed Care, Other (non HMO)

## 2021-01-07 ENCOUNTER — Other Ambulatory Visit: Payer: Self-pay

## 2021-01-07 DIAGNOSIS — R053 Chronic cough: Secondary | ICD-10-CM | POA: Diagnosis not present

## 2021-01-07 DIAGNOSIS — R059 Cough, unspecified: Secondary | ICD-10-CM

## 2021-01-07 HISTORY — DX: Influenza due to unidentified influenza virus with other respiratory manifestations: J11.1

## 2021-01-07 MED ORDER — AZITHROMYCIN 250 MG PO TABS: ORAL_TABLET | ORAL | 0 refills | Status: DC

## 2021-01-07 NOTE — Discharge Instructions (Signed)
Take plain guaifenesin (1200mg  extended release tabs such as Mucinex) twice daily, with plenty of water, for cough and congestion. Get adequate rest.   Stop Zyzal for now but continue Flonase spray. May take Delsym Cough Suppressant ("12 Hour Cough Relief") at bedtime for nighttime cough.

## 2021-01-07 NOTE — ED Provider Notes (Signed)
[facility]    CSN: [id number] Arrival date & time: 01/07/21  1528      History   Chief Complaint Chief Complaint  Patient presents with   Cough    Cough, and crackling of lungs. X1 week    HPI Melanie Glass is a 41 y.o. female.   Patient was diagnosed with influenza 9 days ago at a [facility] where she was prescribed Tamiflu, a five day course of prednisone, and promethazine DM suspension.  She states that she has generally improved except for a persistent non-productive cough and fatigue, but denies fever.  She has experienced some vague "crackling" in her left anterior chest.  She asked a healthcare provider to listen to her lungs yesterday, reporting possible rales in her left lower chest.  The history is provided by the patient.   Past Medical History:  Diagnosis Date   Allergy    Environmental and seasonal allergies    Flu    GERD (gastroesophageal reflux disease)    Uterine fibroid     Patient Active Problem List   Diagnosis Date Noted   DERMATOFIBROMA 06/15/2008   HEMORRHOIDS, NOS 10/10/2005   GASTROESOPHAGEAL REFLUX, NO ESOPHAGITIS 10/10/2005    Past Surgical History:  Procedure Laterality Date   INTRAUTERINE DEVICE (IUD) INSERTION      OB History   No obstetric history on file.      Home Medications    Prior to Admission medications   Medication Sig Start Date End Date Taking? Authorizing Provider  azithromycin (ZITHROMAX Z-PAK) 250 MG tablet Take 2 tabs today; then begin one tab once daily for 4 more days. 01/07/21  Yes Kandra Nicolas, MD  fluticasone (FLONASE) 50 MCG/ACT nasal spray Place 2 sprays into both nostrils daily. 11/28/20  Yes Raylene Everts, MD  ibuprofen (ADVIL) 800 MG tablet Take 800 mg by mouth every 8 (eight) hours as needed. 02/29/20  Yes [provider]  levocetirizine (XYZAL) 5 MG tablet Take 5 mg by mouth every evening.   Yes [provider]  levonorgestrel (MIRENA) 20 MCG/24HR IUD 1 each by  Intrauterine route once.   Yes [provider]  pantoprazole (PROTONIX) 20 MG tablet Take 1-2 tablets by mouth daily. See you at your January appointment. 12/24/20  Yes Armbruster, Carlota Raspberry, MD    Family History Family History  Problem Relation Age of Onset   Mental illness Mother    Hypertension Mother    Sleep apnea Mother    Hypertension Father    Sleep apnea Father    Pancreatic cancer Paternal Uncle    Stomach cancer Neg Hx    Colon cancer Neg Hx    Colon polyps Neg Hx    Esophageal cancer Neg Hx    Rectal cancer Neg Hx     Social History Social History   Tobacco Use   Smoking status: Never   Smokeless tobacco: Never  Vaping Use   Vaping Use: Never used  Substance Use Topics   Alcohol use: Yes    Comment: occ   Drug use: No     Allergies   Patient has no known allergies.   Review of Systems Review of Systems No sore throat + cough No pleuritic pain, but has tightness in left anterior chest No wheezing No nasal congestion No post-nasal drainage No sinus pain/pressure No itchy/red eyes No earache No hemoptysis No SOB No fever/chills No nausea No vomiting No abdominal pain No diarrhea No urinary symptoms No skin rash +  fatigue No myalgias No headache   Physical Exam Triage Vital Signs ED Triage Vitals  Enc Vitals Group     BP 01/07/21 1755 136/87     Pulse Rate 01/07/21 1755 80     Resp 01/07/21 1755 18     Temp 01/07/21 1755 98.7 F (37.1 C)     Temp Source 01/07/21 1755 Oral     SpO2 01/07/21 1755 98 %     Weight 01/07/21 1747 175 lb (79.4 kg)     Height 01/07/21 1747 5\' 5"  (1.651 m)     Head Circumference --      Peak Flow --      Pain Score 01/07/21 1747 0     Pain Loc --      Pain Edu? --      Excl. in [place]? --    No data found.  Updated Vital Signs BP 136/87 (BP Location: Left Arm)    Pulse 80    Temp 98.7 F (37.1 C) (Oral)    Resp 18    Ht 5\' 5"  (1.651 m)    Wt 79.4 kg    SpO2 98%    BMI 29.12 kg/m   Visual  Acuity Right Eye Distance:   Left Eye Distance:   Bilateral Distance:    Right Eye Near:   Left Eye Near:    Bilateral Near:     Physical Exam Nursing notes and Vital Signs reviewed. Appearance:  Patient appears stated age, and in no acute distress Eyes:  Pupils are equal, round, and reactive to light and accomodation.  Extraocular movement is intact.  Conjunctivae are not inflamed  Ears:  Canals normal.  Tympanic membranes normal.  Nose:  Mildly congested turbinates.  No sinus tenderness.   Pharynx:  Normal Neck:  Supple.  Mildly enlarged lateral nodes are present, tender to palpation on the left.   Lungs:  Clear to auscultation.  Breath sounds are equal.  Moving air well. Heart:  Regular rate and rhythm without murmurs, rubs, or gallops.  Abdomen:  Nontender without masses or hepatosplenomegaly.  Bowel sounds are present.  No CVA or flank tenderness.  Extremities:  No edema.  Skin:  No rash present.   UC Treatments / Results  Labs (all labs ordered are listed, but only abnormal results are displayed) Labs Reviewed - No data to display  EKG   Radiology DG Chest 2 View  Result Date: 01/07/2021 CLINICAL DATA:  History of influenza, cough EXAM: CHEST - 2 VIEW COMPARISON:  None. FINDINGS: The heart size and mediastinal contours are within normal limits. Both lungs are clear. The visualized skeletal structures are unremarkable. IMPRESSION: No active cardiopulmonary disease. Electronically Signed   By: Donavan Foil M.D.   On: 01/07/2021 18:49    Procedures Procedures (including critical care time)  Medications Ordered in UC Medications - No data to display  Initial Impression / Assessment and Plan / UC Course  I have reviewed the triage vital signs and the nursing notes.  Pertinent labs & imaging results that were available during my care of the patient were reviewed by me and considered in my medical decision making (see chart for details).    Negative chest x-ray  reassuring.  ?developing bronchitis. Begin Z-pack. Followup with Family Doctor if not improved in one week.   Final Clinical Impressions(s) / UC Diagnoses   Final diagnoses:  Persistent cough     Discharge Instructions      Take plain guaifenesin (1200mg  extended release  tabs such as Mucinex) twice daily, with plenty of water, for cough and congestion. Get adequate rest.   Stop Zyzal for now but continue Flonase spray. May take Delsym Cough Suppressant ("12 Hour Cough Relief") at bedtime for nighttime cough.         ED Prescriptions     Medication Sig Dispense Auth. Provider   azithromycin (ZITHROMAX Z-PAK) 250 MG tablet Take 2 tabs today; then begin one tab once daily for 4 more days. 6 tablet Kandra Nicolas, MD         Kandra Nicolas, MD 01/08/21 [phone]

## 2021-01-07 NOTE — ED Triage Notes (Signed)
Pt states that she has a cough and some crackling of lungs. X1 week Pt states that she is vaccinated for covid. Pt states that she has had flu vaccine.

## 2021-01-18 ENCOUNTER — Ambulatory Visit: Payer: Managed Care, Other (non HMO) | Admitting: Gastroenterology

## 2021-01-18 ENCOUNTER — Encounter: Payer: Self-pay | Admitting: Gastroenterology

## 2021-01-18 VITALS — BP 118/72 | HR 67 | Ht 65.0 in | Wt 182.0 lb

## 2021-01-18 DIAGNOSIS — K219 Gastro-esophageal reflux disease without esophagitis: Secondary | ICD-10-CM | POA: Diagnosis not present

## 2021-01-18 DIAGNOSIS — Z79899 Other long term (current) drug therapy: Secondary | ICD-10-CM | POA: Diagnosis not present

## 2021-01-18 MED ORDER — PANTOPRAZOLE SODIUM 40 MG PO TBEC: 40.0000 mg | DELAYED_RELEASE_TABLET | Freq: Every day | ORAL | 3 refills | Status: DC

## 2021-01-18 NOTE — Patient Instructions (Signed)
If you are age 42 or older, your body mass index should be between 23-30. Your Body mass index is 30.29 kg/m. If this is out of the aforementioned range listed, please consider follow up with your Primary Care Provider.  If you are age 7 or younger, your body mass index should be between 19-25. Your Body mass index is 30.29 kg/m. If this is out of the aformentioned range listed, please consider follow up with your Primary Care Provider.   ________________________________________________________  The [facility] providers would like to encourage you to use [facility] to communicate with providers for non-urgent requests or questions.  Due to long hold times on the telephone, sending your provider a message by [facility] may be a faster and more efficient way to get a response.  Please allow 48 business hours for a response.  Please remember that this is for non-urgent requests.  _______________________________________________________  We have sent the following medications to your pharmacy for you to pick up at your convenience: Protonix 40 mg - Take once daily  Please follow up in 1 year.  Thank you for entrusting me with your care and for choosing [facility], Dr. Paderborn Cellar

## 2021-01-18 NOTE — Progress Notes (Signed)
HPI :  41 year old female with a history of GERD, here for follow-up visit for this issue.  She was last seen in July 2021.  Recall she has had reflux symptoms for several years now, greater than 6 years at least.  Typical symptoms associated with this have been pyrosis and epigastric pressure.  Recall she has been on Protonix 40 mg once daily since 2019.  She states the regimen has generally worked well for her since have last seen her and she can eat what she wants without much of any breakthrough.  She does have some food triggers, typically tomato-based foods which can rarely cause symptoms.  She has not been able to tolerate a lower dose Protonix without symptoms.  Denies any dysphagia.  No nausea or vomiting.  He denies any family history of esophageal cancer.  She does have a lingering cough as she is recovered from the flu in recent weeks but does not feel it is related to her reflux symptoms.  She has never had a prior EGD.   She had a colonoscopy for some altered bowel habits in 2019 including some bleeding, it was thought to be due to hemorrhoids at the time.   Prior work-up: Colonoscopy 10/01/2017 - The perianal and digital rectal examinations were normal. - The terminal ileum appeared normal. - Internal hemorrhoids were found during retroflexion. The hemorrhoids were small. - The colon was tortuous. - The exam was otherwise without abnormality. No polyps or inflammatory changes.   Past Medical History:  Diagnosis Date   Allergy    Environmental and seasonal allergies    Flu    GERD (gastroesophageal reflux disease)    Uterine fibroid      Past Surgical History:  Procedure Laterality Date   INTRAUTERINE DEVICE (IUD) INSERTION     Family History  Problem Relation Age of Onset   Mental illness Mother    Hypertension Mother    Sleep apnea Mother    Hypertension Father    Sleep apnea Father    Pancreatic cancer Paternal Uncle    Stomach cancer Neg Hx    Colon cancer Neg  Hx    Colon polyps Neg Hx    Esophageal cancer Neg Hx    Rectal cancer Neg Hx    Social History   Tobacco Use   Smoking status: Never   Smokeless tobacco: Never  Vaping Use   Vaping Use: Never used  Substance Use Topics   Alcohol use: Yes    Comment: occ   Drug use: No   Current Outpatient Medications  Medication Sig Dispense Refill   fluticasone (FLONASE) 50 MCG/ACT nasal spray Place 2 sprays into both nostrils daily. 16 g 0   ibuprofen (ADVIL) 800 MG tablet Take 800 mg by mouth every 8 (eight) hours as needed.     levocetirizine (XYZAL) 5 MG tablet Take 5 mg by mouth every evening.     levonorgestrel (MIRENA) 20 MCG/24HR IUD 1 each by Intrauterine route once.     pantoprazole (PROTONIX) 20 MG tablet Take 1-2 tablets by mouth daily. See you at your January appointment. 180 tablet 0   No current facility-administered medications for this visit.   No Known Allergies   Review of Systems: All systems reviewed and negative except where noted in HPI.    DG Chest 2 View  Result Date: 01/07/2021 CLINICAL DATA:  History of influenza, cough EXAM: CHEST - 2 VIEW COMPARISON:  None. FINDINGS: The heart size and mediastinal contours are  within normal limits. Both lungs are clear. The visualized skeletal structures are unremarkable. IMPRESSION: No active cardiopulmonary disease. Electronically Signed   By: Donavan Foil M.D.   On: 01/07/2021 18:49    Lab Results  Component Value Date   WBC 2.6 (L) 03/24/2020   HGB 13.6 03/24/2020   HCT 41.0 03/24/2020   MCV 99.8 03/24/2020   PLT 186 03/24/2020    Lab Results  Component Value Date   CREATININE 0.92 03/24/2020   BUN 13 03/24/2020   NA 138 03/24/2020   K 4.2 03/24/2020   CL 103 03/24/2020   CO2 28 03/24/2020    Lab Results  Component Value Date   ALT 13 03/24/2020   AST 16 03/24/2020   ALKPHOS 38 07/03/2016   BILITOT 0.5 03/24/2020     Physical Exam: BP 118/72    Pulse 67    Ht 5\' 5"  (1.651 m)    Wt 182 lb (82.6 kg)     BMI 30.29 kg/m  Constitutional: Pleasant,well-developed, female in no acute distress. Neurological: Alert and oriented to person place and time. Psychiatric: Normal mood and affect. Behavior is normal.   ASSESSMENT AND PLAN: 41 year old female here for reassessment of following:  GERD Long-term use of PPI  She has typical symptoms of reflux that her well controlled on moderate dose Protonix 40 mg once daily.  As long as she takes her medication she really does not have much breakthrough but has not been able to get by on a lower dose of Protonix.  We discussed long-term use of PPI and associated risks for this, she understands and wishes to continue.  She is not interested in TIF or surgical therapy.  Over time, perhaps around age 41 will consider an EGD for screening for Barrett's given chronicity of her symptoms but do not feel like we need to do so sooner in the absence of alarm symptoms, anemia, dysphagia etc.  She agrees with the plan, medication refilled, I will see her in 1 year for reassessment or sooner with any acute issues otherwise.  Jolly Mango, MD [facility]

## 2021-03-25 ENCOUNTER — Other Ambulatory Visit: Payer: Self-pay | Admitting: Gastroenterology

## 2021-03-25 DIAGNOSIS — K219 Gastro-esophageal reflux disease without esophagitis: Secondary | ICD-10-CM

## 2021-10-03 ENCOUNTER — Other Ambulatory Visit: Payer: Self-pay | Admitting: Family Medicine

## 2021-10-03 DIAGNOSIS — Z1231 Encounter for screening mammogram for malignant neoplasm of breast: Secondary | ICD-10-CM

## 2021-10-06 ENCOUNTER — Ambulatory Visit (INDEPENDENT_AMBULATORY_CARE_PROVIDER_SITE_OTHER): Payer: Managed Care, Other (non HMO)

## 2021-10-06 DIAGNOSIS — Z1231 Encounter for screening mammogram for malignant neoplasm of breast: Secondary | ICD-10-CM

## 2021-10-07 NOTE — Progress Notes (Signed)
Please call patient. Normal mammogram.  Repeat in 1 year.  

## 2021-11-15 ENCOUNTER — Ambulatory Visit (INDEPENDENT_AMBULATORY_CARE_PROVIDER_SITE_OTHER): Payer: Managed Care, Other (non HMO) | Admitting: Family Medicine

## 2021-11-15 ENCOUNTER — Encounter: Payer: Self-pay | Admitting: Family Medicine

## 2021-11-15 VITALS — BP 130/82 | HR 72 | Ht 65.0 in | Wt 186.0 lb

## 2021-11-15 DIAGNOSIS — R8290 Unspecified abnormal findings in urine: Secondary | ICD-10-CM

## 2021-11-15 DIAGNOSIS — Z23 Encounter for immunization: Secondary | ICD-10-CM | POA: Diagnosis not present

## 2021-11-15 DIAGNOSIS — Z0000 Encounter for general adult medical examination without abnormal findings: Secondary | ICD-10-CM

## 2021-11-15 NOTE — Progress Notes (Signed)
Complete physical exam  Patient: Melanie Glass   DOB: 12/15/1980   41 y.o. Female  MRN: [id number]  Subjective:    Chief Complaint  Patient presents with   Annual Exam    [name] is a 41 y.o. female who presents today for a complete physical exam. She reports consuming a general diet.  Does work out some.   She generally feels well. She reports sleeping fairly well. She does not have additional problems to discuss today.   Also noticed a little bit of discomfort in that left mid back area for the last day or 2.  She thinks it may just be from working out she has had some slightly cloudy urine and some increased frequency but no burning no fever.  She is also had a little bit of left lower quadrant tenderness on and off that started about last week.  She has an IUD and so her periods are very regular but her last one was about 2 weeks ago.   Most recent fall risk assessment:    11/15/2021    9:28 AM  [place] in the past year? 1  Number falls in past yr: 0  Injury with Fall? 0  Risk for fall due to : No Fall Risks  Follow up Falls evaluation completed     Most recent depression screenings:    11/15/2021    9:31 AM 03/24/2020    8:42 AM  PHQ 2/9 Scores  PHQ - 2 Score 0 0        Patient Care Team: Hali Marry, MD as PCP - General   Outpatient Medications Prior to Visit  Medication Sig   fluticasone (FLONASE) 50 MCG/ACT nasal spray Place 2 sprays into both nostrils daily.   ibuprofen (ADVIL) 800 MG tablet Take 800 mg by mouth every 8 (eight) hours as needed.   levocetirizine (XYZAL) 5 MG tablet Take 5 mg by mouth every evening.   levonorgestrel (MIRENA) 20 MCG/24HR IUD 1 each by Intrauterine route once.   pantoprazole (PROTONIX) 40 MG tablet Take 1 tablet (40 mg total) by mouth daily.   No facility-administered medications prior to visit.    ROS        Objective:     BP 130/82   Pulse 72   Ht '5\' 5"'$  (1.651 m)   Wt 186 lb (84.4 kg)    SpO2 99%   BMI 30.95 kg/m    Physical Exam Vitals and nursing note reviewed.  Constitutional:      Appearance: She is well-developed.  HENT:     Head: Normocephalic and atraumatic.     Right Ear: External ear normal.     Left Ear: External ear normal.     Nose: Nose normal.     Mouth/Throat:     Pharynx: Oropharynx is clear.  Eyes:     Conjunctiva/sclera: Conjunctivae normal.     Pupils: Pupils are equal, round, and reactive to light.  Neck:     Thyroid: No thyromegaly.  Cardiovascular:     Rate and Rhythm: Normal rate and regular rhythm.     Heart sounds: Normal heart sounds.  Pulmonary:     Effort: Pulmonary effort is normal.     Breath sounds: Normal breath sounds. No wheezing.  Abdominal:     General: Bowel sounds are normal.     Palpations: Abdomen is soft.     Tenderness: There is abdominal tenderness. There is no guarding.  Comments: Mild tenderness in the LLQ.    Musculoskeletal:     Cervical back: Neck supple.  Lymphadenopathy:     Cervical: No cervical adenopathy.  Skin:    General: Skin is warm and dry.     Coloration: Skin is not pale.  Neurological:     Mental Status: She is alert and oriented to person, place, and time.  Psychiatric:        Behavior: Behavior normal.      No results found for any visits on 11/15/21.     Assessment & Plan:    Routine Health Maintenance and Physical Exam  Immunization History  Administered Date(s) Administered   Influenza Whole 10/02/2008, 10/11/2009   Influenza,inj,Quad PF,6+ Mos 10/31/2012, 10/17/2013, 10/19/2014, 11/02/2015, 11/11/2016, 11/02/2017, 09/28/2018, 10/17/2019, 11/15/2021   Moderna Sars-Covid-2 Vaccination 03/21/2019, 04/18/2019, 12/05/2019   PPD Test 06/05/2014, 01/25/2016, 01/24/2017, 07/16/2018, 07/30/2018   Td 01/02/2002   Tdap 11/17/2013    Health Maintenance  Topic Date Due   COVID-19 Vaccine (4 - Moderna risk series) 12/02/2022 (Originally 01/30/2020)   TETANUS/TDAP  11/18/2023    PAP SMEAR-Modifier  02/13/2024   INFLUENZA VACCINE  Completed   Hepatitis C Screening  Completed   HIV Screening  Completed   Pneumococcal Vaccine 70-68 Years old  Aged Out   HPV VACCINES  Aged Out    Discussed health benefits of physical activity, and encouraged her to engage in regular exercise appropriate for her age and condition.  Problem List Items Addressed This Visit   None Visit Diagnoses     Wellness examination    -  Primary   Relevant Orders   TSH   Lipid Panel w/reflex Direct LDL   COMPLETE METABOLIC PANEL WITH GFR   CBC with Differential/Platelet   Urinalysis, Routine w reflex microscopic   Need for immunization against influenza       Relevant Orders   Flu Vaccine QUAD 21moIM (Fluarix, Fluzone & Alfiuria Quad PF) (Completed)   Cloudy urine       Relevant Orders   Urinalysis, Routine w reflex microscopic     Keep up a regular exercise program and make sure you are eating a healthy diet Try to eat 4 servings of dairy a day, or if you are lactose intolerant take a calcium with vitamin D daily.  Your vaccines are up to date.   Return in about 1 year (around 11/16/2022) for Wellness Exam.     CBeatrice Lecher MD

## 2021-11-16 LAB — CBC WITH DIFFERENTIAL/PLATELET
Absolute Monocytes: 315 cells/uL (ref 200–950)
Basophils Absolute: 21 cells/uL (ref 0–200)
Basophils Relative: 1 %
Eosinophils Absolute: 21 cells/uL (ref 15–500)
Eosinophils Relative: 1 %
HCT: 39.5 % (ref 35.0–45.0)
Hemoglobin: 13.1 g/dL (ref 11.7–15.5)
Lymphs Abs: 953 cells/uL (ref 850–3900)
MCH: 32.8 pg (ref 27.0–33.0)
MCHC: 33.2 g/dL (ref 32.0–36.0)
MCV: 99 fL (ref 80.0–100.0)
MPV: 11.1 fL (ref 7.5–12.5)
Monocytes Relative: 15 %
Neutro Abs: 790 cells/uL — ABNORMAL LOW (ref 1500–7800)
Neutrophils Relative %: 37.6 %
Platelets: 183 10*3/uL (ref 140–400)
RBC: 3.99 10*6/uL (ref 3.80–5.10)
RDW: 12.1 % (ref 11.0–15.0)
Total Lymphocyte: 45.4 %
WBC: 2.1 10*3/uL — ABNORMAL LOW (ref 3.8–10.8)

## 2021-11-16 LAB — COMPLETE METABOLIC PANEL WITH GFR
AG Ratio: 1.8 (calc) (ref 1.0–2.5)
ALT: 16 U/L (ref 6–29)
AST: 20 U/L (ref 10–30)
Albumin: 4.3 g/dL (ref 3.6–5.1)
Alkaline phosphatase (APISO): 37 U/L (ref 31–125)
BUN: 10 mg/dL (ref 7–25)
CO2: 30 mmol/L (ref 20–32)
Calcium: 9.4 mg/dL (ref 8.6–10.2)
Chloride: 105 mmol/L (ref 98–110)
Creat: 0.9 mg/dL (ref 0.50–0.99)
Globulin: 2.4 g/dL (calc) (ref 1.9–3.7)
Glucose, Bld: 84 mg/dL (ref 65–139)
Potassium: 4.5 mmol/L (ref 3.5–5.3)
Sodium: 138 mmol/L (ref 135–146)
Total Bilirubin: 0.6 mg/dL (ref 0.2–1.2)
Total Protein: 6.7 g/dL (ref 6.1–8.1)
eGFR: 82 mL/min/{1.73_m2} (ref >=60)

## 2021-11-16 LAB — URINALYSIS, ROUTINE W REFLEX MICROSCOPIC
Bilirubin Urine: NEGATIVE
Glucose, UA: NEGATIVE
Hgb urine dipstick: NEGATIVE
Ketones, ur: NEGATIVE
Leukocytes,Ua: NEGATIVE
Nitrite: NEGATIVE
Protein, ur: NEGATIVE
Specific Gravity, Urine: 1.013 (ref 1.001–1.035)
pH: 8 (ref 5.0–8.0)

## 2021-11-16 LAB — LIPID PANEL W/REFLEX DIRECT LDL
Cholesterol: 203 mg/dL — ABNORMAL HIGH (ref <200)
HDL: 82 mg/dL (ref >=50)
LDL Cholesterol (Calc): 108 mg/dL (calc) — ABNORMAL HIGH
Non-HDL Cholesterol (Calc): 121 mg/dL (calc) (ref <130)
Total CHOL/HDL Ratio: 2.5 (calc) (ref <5.0)
Triglycerides: 45 mg/dL (ref <150)

## 2021-11-16 LAB — TSH: TSH: 0.93 mIU/L

## 2021-11-16 NOTE — Progress Notes (Signed)
HI Melanie Glass, your LDL is better this year!!! [place] work!! Your white blood cells are stable. Your thyroid and metabolic panel is normal. Your urine culture is normal.

## 2022-01-25 ENCOUNTER — Encounter: Payer: Self-pay | Admitting: Gastroenterology

## 2022-02-22 ENCOUNTER — Other Ambulatory Visit: Payer: Self-pay | Admitting: Gastroenterology

## 2022-03-19 ENCOUNTER — Other Ambulatory Visit: Payer: Self-pay | Admitting: Gastroenterology

## 2022-04-21 ENCOUNTER — Other Ambulatory Visit: Payer: Self-pay | Admitting: Gastroenterology

## 2022-05-08 ENCOUNTER — Other Ambulatory Visit: Payer: Self-pay | Admitting: Gastroenterology

## 2022-05-24 ENCOUNTER — Telehealth: Payer: Self-pay | Admitting: Gastroenterology

## 2022-05-24 MED ORDER — PANTOPRAZOLE SODIUM 40 MG PO TBEC: 40.0000 mg | DELAYED_RELEASE_TABLET | Freq: Every day | ORAL | 0 refills | Status: DC

## 2022-05-24 NOTE — Telephone Encounter (Signed)
Inbound call from patient ,requesting a 90 days refill for Pantoprazole. She has a follow up on 8/27.Marland KitchenPlease advise

## 2022-05-24 NOTE — Telephone Encounter (Signed)
90 day supply of Protonix sent to pharmacy

## 2022-08-23 ENCOUNTER — Other Ambulatory Visit: Payer: Self-pay | Admitting: Gastroenterology

## 2022-08-30 ENCOUNTER — Ambulatory Visit: Payer: Managed Care, Other (non HMO) | Admitting: Gastroenterology

## 2022-09-23 ENCOUNTER — Other Ambulatory Visit: Payer: Self-pay | Admitting: Gastroenterology

## 2022-10-07 ENCOUNTER — Other Ambulatory Visit: Payer: Self-pay | Admitting: Gastroenterology

## 2022-10-31 ENCOUNTER — Other Ambulatory Visit: Payer: Self-pay | Admitting: Gastroenterology

## 2022-11-09 ENCOUNTER — Ambulatory Visit: Payer: Managed Care, Other (non HMO) | Admitting: Physician Assistant

## 2022-11-15 ENCOUNTER — Encounter: Payer: Self-pay | Admitting: Gastroenterology

## 2022-11-15 ENCOUNTER — Ambulatory Visit: Payer: Managed Care, Other (non HMO) | Admitting: Gastroenterology

## 2022-11-15 VITALS — BP 120/90 | Temp 78.0°F | Ht 65.0 in | Wt 179.0 lb

## 2022-11-15 DIAGNOSIS — Z79899 Other long term (current) drug therapy: Secondary | ICD-10-CM

## 2022-11-15 DIAGNOSIS — K219 Gastro-esophageal reflux disease without esophagitis: Secondary | ICD-10-CM

## 2022-11-15 MED ORDER — PANTOPRAZOLE SODIUM 40 MG PO TBEC: 40.0000 mg | DELAYED_RELEASE_TABLET | Freq: Every day | ORAL | 3 refills | Status: DC

## 2022-11-15 NOTE — Patient Instructions (Addendum)
We have sent the following medications to your pharmacy for you to pick up at your convenience: Protonix 40 mg: Take once daily  We are giving you a TIF handout today.  Please follow up in 1 year.  Thank you for entrusting me with your care and for choosing [facility], Dr. Ileene Patrick    If your blood pressure at your visit was 140/90 or greater, please contact your primary care physician to follow up on this. ______________________________________________________  If you are age 16 or older, your body mass index should be between 23-30. Your Body mass index is 29.79 kg/m. If this is out of the aforementioned range listed, please consider follow up with your Primary Care Provider.  If you are age 61 or younger, your body mass index should be between 19-25. Your Body mass index is 29.79 kg/m. If this is out of the aformentioned range listed, please consider follow up with your Primary Care Provider.  ________________________________________________________  The [facility] providers would like to encourage you to use [facility] to communicate with providers for non-urgent requests or questions.  Due to long hold times on the telephone, sending your provider a message by [facility] may be a faster and more efficient way to get a response.  Please allow 48 business hours for a response.  Please remember that this is for non-urgent requests.  _______________________________________________________  Due to recent changes in healthcare laws, you may see the results of your imaging and laboratory studies on MyChart before your provider has had a chance to review them.  We understand that in some cases there may be results that are confusing or concerning to you. Not all laboratory results come back in the same time frame and the provider may be waiting for multiple results in order to interpret others.  Please give [place] 48 hours in order for your provider to thoroughly review all the results  before contacting the office for clarification of your results.

## 2022-11-15 NOTE — Progress Notes (Signed)
HPI :  42 year old female here for follow-up visit for history of GERD and long-term PPI use.  I last saw her in January 2023.  Recall she has had reflux symptoms ongoing for now at least 8 years or so.  Her typical GERD symptoms have been pyrosis and epigastric pressure.  She has been maintained on Protonix 40 mg daily since have last seen her.  He continues to work well for her.  She generally does not have any breakthrough on the regimen unless she has particular trigger foods, often spicy foods, that can cause a bit of reflux.  Recall we have tried to reduce her dose to 20 mg daily in the past but this was not successful and she required escalation of dosing to control symptoms.  She has no dysphagia, no abdominal pains, she feels well without complaints.  No family history of esophageal cancer.  She is never used tobacco.  No prior EGD.  We had discussion about her course to date and risks of the regimen if she wanted to consider other alternatives for treatment.   She had a colonoscopy for some altered bowel habits in 2019 including some bleeding, it was thought to be due to hemorrhoids at the time.   Prior work-up: Colonoscopy 10/01/2017 - The perianal and digital rectal examinations were normal. - The terminal ileum appeared normal. - Internal hemorrhoids were found during retroflexion. The hemorrhoids were small. - The colon was tortuous. - The exam was otherwise without abnormality. No polyps or inflammatory changes.   Past Medical History:  Diagnosis Date   Allergy    Environmental and seasonal allergies    Flu    GERD (gastroesophageal reflux disease)    Uterine fibroid      Past Surgical History:  Procedure Laterality Date   INTRAUTERINE DEVICE (IUD) INSERTION     Family History  Problem Relation Age of Onset   Mental illness Mother    Hypertension Mother    Sleep apnea Mother    Hypertension Father    Sleep apnea Father    Pancreatic cancer Paternal Uncle     Stomach cancer Neg Hx    Colon cancer Neg Hx    Colon polyps Neg Hx    Esophageal cancer Neg Hx    Rectal cancer Neg Hx    Social History   Tobacco Use   Smoking status: Never   Smokeless tobacco: Never  Vaping Use   Vaping status: Never Used  Substance Use Topics   Alcohol use: Yes    Comment: occ   Drug use: No   Current Outpatient Medications  Medication Sig Dispense Refill   ibuprofen (ADVIL) 800 MG tablet Take 800 mg by mouth every 8 (eight) hours as needed.     levocetirizine (XYZAL) 5 MG tablet Take 5 mg by mouth every evening.     levonorgestrel (MIRENA) 20 MCG/24HR IUD 1 each by Intrauterine route once.     pantoprazole (PROTONIX) 40 MG tablet Take 1 tablet (40 mg total) by mouth daily. Please keep your November appointment for further refills.  Thank you 30 tablet 1   No current facility-administered medications for this visit.   No Known Allergies   Review of Systems: All systems reviewed and negative except where noted in HPI.    No results found.  Physical Exam: BP (!) 120/90 (BP Location: Left Arm, Patient Position: Sitting, Cuff Size: Normal)   Temp (!) 78 F (25.6 C)   Ht 5\' 5"  (1.651 m)  Wt 179 lb (81.2 kg)   SpO2 98%   BMI 29.79 kg/m  Constitutional: Pleasant,well-developed, female in no acute distress. Neurological: Alert and oriented to person place and time. Psychiatric: Normal mood and affect. Behavior is normal.   ASSESSMENT: 42 y.o. female here for assessment of the following  1. Gastroesophageal reflux disease, unspecified whether esophagitis present   2. Long-term current use of proton pump inhibitor therapy    She has been on Protonix 40 mg daily for some time now and this controls her index reflux symptoms quite well for the most part, and she is happy with the regimen.  She has no alarm symptoms.  We did discuss long-term risks of chronic PPI use, to include CKD, increased risk for bone fracture, C. difficile, etc.  Her renal  function is stable and recommend she see her PCP yearly for physical and labs.  Long-term want to use the lowest dose of PPI needed to control symptoms.  She has tried dose reduction in the past and it has not gone well with recurrent symptoms.  She would prefer to stay on current dosing of Protonix.  We discussed alternatives for reflux treatment to include surgery and TIF.  She is a candidate for TIF if she wanted to come off PPI and consider endoscopic therapy.  I provided her handout on this, she wants to think about it.  If she did wish to pursue TIF would need an endoscopy to evaluate her candidacy for that.  We otherwise discussed that if she continues to be on long-term PPI she will need an EGD at some point for Barrett's screening.  Given her age and lack of risk factors otherwise, I do not think she needs it now but may consider it in the upcoming years.  Will certainly consider it sooner if she has any symptoms that warrant that. She agrees.   PLAN: - refill protonix 40mg  / day - had a good discussion about long term risks / benefits, use lowest dose needed to control symptoms - discussed alternative treatments, including TIF for which she is a candidate. Handout provided, she will contact us if she wishes to pursue this in the future - EGD at some point in the future for Barrett's screening, she does not warrant it now - see PCP for physical / labs - f/u 1 year or sooner with issues  Harlin Rain, MD [facility]

## 2023-01-10 ENCOUNTER — Other Ambulatory Visit: Payer: Self-pay | Admitting: Family Medicine

## 2023-01-10 DIAGNOSIS — Z1231 Encounter for screening mammogram for malignant neoplasm of breast: Secondary | ICD-10-CM

## 2023-03-07 ENCOUNTER — Ambulatory Visit: Payer: Managed Care, Other (non HMO)

## 2023-03-07 DIAGNOSIS — Z1231 Encounter for screening mammogram for malignant neoplasm of breast: Secondary | ICD-10-CM

## 2023-03-12 ENCOUNTER — Other Ambulatory Visit: Payer: Self-pay | Admitting: Family Medicine

## 2023-03-12 DIAGNOSIS — R928 Other abnormal and inconclusive findings on diagnostic imaging of breast: Secondary | ICD-10-CM

## 2023-03-13 ENCOUNTER — Encounter: Payer: Self-pay | Admitting: Physician Assistant

## 2023-03-13 NOTE — Progress Notes (Signed)
 Melanie Glass,   You do have some left breast asymmetry. Imaging will be contacting you for more imaging to get a better look.

## 2023-03-19 ENCOUNTER — Ambulatory Visit (INDEPENDENT_AMBULATORY_CARE_PROVIDER_SITE_OTHER): Payer: Managed Care, Other (non HMO) | Admitting: Family Medicine

## 2023-03-19 VITALS — BP 149/104 | HR 72 | Ht 65.0 in | Wt 179.0 lb

## 2023-03-19 DIAGNOSIS — Z0000 Encounter for general adult medical examination without abnormal findings: Secondary | ICD-10-CM

## 2023-03-19 DIAGNOSIS — Z23 Encounter for immunization: Secondary | ICD-10-CM

## 2023-03-19 MED ORDER — LOSARTAN POTASSIUM 25 MG PO TABS: 25.0000 mg | ORAL_TABLET | Freq: Every day | ORAL | 0 refills | Status: DC

## 2023-03-19 NOTE — Progress Notes (Signed)
 Complete physical exam  Patient: Melanie Glass   DOB: 1980/09/27   43 y.o. Female  MRN: [id number]  Subjective:    Chief Complaint  Patient presents with   Annual Exam    Melanie Glass is a 43 y.o. female who presents today for a complete physical exam. She reports consuming a general diet.  Works out occasionally   She generally feels well.  She does not have additional problems to discuss today.   She has had trouble with a lot of nasla drainage lately.  She does have spring allergies. Some mild nasal congestion. Had allergy testing done in 2019.     Most recent fall risk assessment:    03/19/2023    8:06 AM  Fall Risk   Falls in the past year? 0  Number falls in past yr: 0  Injury with Fall? 0  Risk for fall due to : No Fall Risks  Follow up Falls evaluation completed     Most recent depression screenings:    03/19/2023    8:06 AM 11/15/2021    9:31 AM  PHQ 2/9 Scores  PHQ - 2 Score 0 0        Patient Care Team: Agapito Games, MD as PCP - General Speaks, Winfield Rast, MD as Referring Physician (Obstetrics)   Outpatient Medications Prior to Visit  Medication Sig   levocetirizine (XYZAL) 5 MG tablet Take 5 mg by mouth every evening.   levonorgestrel (MIRENA) 20 MCG/24HR IUD 1 each by Intrauterine route once.   pantoprazole (PROTONIX) 40 MG tablet Take 1 tablet (40 mg total) by mouth daily.   [DISCONTINUED] ibuprofen (ADVIL) 800 MG tablet Take 800 mg by mouth every 8 (eight) hours as needed.   No facility-administered medications prior to visit.    ROS        Objective:     BP (!) 149/104   Pulse 72   Ht 5\' 5"  (1.651 m)   Wt 179 lb (81.2 kg)   SpO2 99%   BMI 29.79 kg/m    Physical Exam Vitals and nursing note reviewed.  Constitutional:      Appearance: Normal appearance.  HENT:     Head: Normocephalic and atraumatic.     Right Ear: Tympanic membrane, ear canal and external ear normal.     Left Ear: Tympanic membrane, ear canal and  external ear normal.     Nose: Nose normal.     Mouth/Throat:     Pharynx: Oropharynx is clear.  Eyes:     Extraocular Movements: Extraocular movements intact.     Conjunctiva/sclera: Conjunctivae normal.     Pupils: Pupils are equal, round, and reactive to light.  Neck:     Thyroid: No thyromegaly.  Cardiovascular:     Rate and Rhythm: Normal rate and regular rhythm.  Pulmonary:     Effort: Pulmonary effort is normal.     Breath sounds: Normal breath sounds.  Abdominal:     General: Bowel sounds are normal.     Palpations: Abdomen is soft.     Tenderness: There is no abdominal tenderness.  Musculoskeletal:        General: No swelling.     Cervical back: Neck supple.  Skin:    General: Skin is warm and dry.  Neurological:     Mental Status: She is alert and oriented to person, place, and time.  Psychiatric:        Mood and Affect: Mood normal.  Behavior: Behavior normal.      No results found for any visits on 03/19/23.     Assessment & Plan:    Routine Health Maintenance and Physical Exam  Immunization History  Administered Date(s) Administered   Influenza Whole 10/02/2008, 10/11/2009   Influenza, Seasonal, Injecte, Preservative Fre 03/19/2023   Influenza,inj,Quad PF,6+ Mos 10/31/2012, 10/17/2013, 10/19/2014, 11/02/2015, 11/11/2016, 11/02/2017, 09/28/2018, 10/17/2019, 11/15/2021   Moderna Sars-Covid-2 Vaccination 03/21/2019, 04/18/2019, 12/05/2019   PPD Test 06/05/2014, 01/25/2016, 01/24/2017, 07/16/2018, 07/30/2018   Td 01/02/2002   Tdap 11/17/2013    Health Maintenance  Topic Date Due   COVID-19 Vaccine (4 - 2024-25 season) 09/03/2022   DTaP/Tdap/Td (3 - Td or Tdap) 11/18/2023   Cervical Cancer Screening (HPV/Pap Cotest)  02/13/2024   INFLUENZA VACCINE  Completed   Hepatitis C Screening  Completed   HIV Screening  Completed   HPV VACCINES  Aged Out    Discussed health benefits of physical activity, and encouraged her to engage in regular exercise  appropriate for her age and condition.  Problem List Items Addressed This Visit   None Visit Diagnoses       Wellness examination    -  Primary   Relevant Orders   CMP14+EGFR   CBC with Differential   Lipid Panel With LDL/HDL Ratio     Encounter for immunization       Relevant Orders   Flu vaccine trivalent PF, 6mos and older(Flulaval,Afluria,Fluarix,Fluzone) (Completed)       Keep up a regular exercise program and make sure you are eating a healthy diet Try to eat 4 servings of dairy a day, or if you are lactose intolerant take a calcium with vitamin D daily.  Your vaccines are up to date.   Return in about 2 weeks (around 04/02/2023) for Nurse BP Check and BMP.     Nani Gasser, MD

## 2023-03-20 ENCOUNTER — Encounter: Payer: Self-pay | Admitting: Family Medicine

## 2023-03-20 ENCOUNTER — Other Ambulatory Visit: Payer: Self-pay | Admitting: Family Medicine

## 2023-03-20 ENCOUNTER — Ambulatory Visit
Admission: RE | Admit: 2023-03-20 | Discharge: 2023-03-20 | Disposition: A | Discharge status: Home / Self Care | Source: Ambulatory Visit | Attending: Family Medicine | Admitting: Family Medicine

## 2023-03-20 ENCOUNTER — Ambulatory Visit

## 2023-03-20 DIAGNOSIS — R928 Other abnormal and inconclusive findings on diagnostic imaging of breast: Secondary | ICD-10-CM

## 2023-03-20 LAB — CMP14+EGFR
ALT: 19 IU/L (ref 0–32)
AST: 23 IU/L (ref 0–40)
Albumin: 4.3 g/dL (ref 3.9–4.9)
Alkaline Phosphatase: 44 IU/L (ref 44–121)
BUN/Creatinine Ratio: 10 (ref 9–23)
BUN: 9 mg/dL (ref 6–24)
Bilirubin Total: 0.4 mg/dL (ref 0.0–1.2)
CO2: 23 mmol/L (ref 20–29)
Calcium: 9.4 mg/dL (ref 8.7–10.2)
Chloride: 102 mmol/L (ref 96–106)
Creatinine, Ser: 0.92 mg/dL (ref 0.57–1.00)
Globulin, Total: 2.1 g/dL (ref 1.5–4.5)
Glucose: 82 mg/dL (ref 70–99)
Potassium: 4.5 mmol/L (ref 3.5–5.2)
Sodium: 137 mmol/L (ref 134–144)
Total Protein: 6.4 g/dL (ref 6.0–8.5)
eGFR: 79 mL/min/{1.73_m2} (ref >59)

## 2023-03-20 LAB — LIPID PANEL WITH LDL/HDL RATIO
Cholesterol, Total: 212 mg/dL — ABNORMAL HIGH (ref 100–199)
HDL: 88 mg/dL (ref >39)
LDL Chol Calc (NIH): 118 mg/dL — ABNORMAL HIGH (ref 0–99)
LDL/HDL Ratio: 1.3 ratio (ref 0.0–3.2)
Triglycerides: 31 mg/dL (ref 0–149)
VLDL Cholesterol Cal: 6 mg/dL (ref 5–40)

## 2023-03-20 LAB — CBC WITH DIFFERENTIAL/PLATELET
Basophils Absolute: 0 10*3/uL (ref 0.0–0.2)
Basos: 1 %
EOS (ABSOLUTE): 0.1 10*3/uL (ref 0.0–0.4)
Eos: 5 %
Hematocrit: 40.7 % (ref 34.0–46.6)
Hemoglobin: 13.2 g/dL (ref 11.1–15.9)
Immature Grans (Abs): 0 10*3/uL (ref 0.0–0.1)
Immature Granulocytes: 0 %
Lymphocytes Absolute: 1.1 10*3/uL (ref 0.7–3.1)
Lymphs: 40 %
MCH: 32.9 pg (ref 26.6–33.0)
MCHC: 32.4 g/dL (ref 31.5–35.7)
MCV: 102 fL — ABNORMAL HIGH (ref 79–97)
Monocytes Absolute: 0.3 10*3/uL (ref 0.1–0.9)
Monocytes: 12 %
Neutrophils Absolute: 1.1 10*3/uL — ABNORMAL LOW (ref 1.4–7.0)
Neutrophils: 42 %
Platelets: 172 10*3/uL (ref 150–450)
RBC: 4.01 x10E6/uL (ref 3.77–5.28)
RDW: 12.5 % (ref 11.7–15.4)
WBC: 2.7 10*3/uL — ABNORMAL LOW (ref 3.4–10.8)

## 2023-03-20 NOTE — Progress Notes (Signed)
 Hi Shanavia, your white blood cells are still low but stable. Your MCV is high so we will have the lab add a b12 and folate level.  Cholesterol a little high so just encourage you to work on eating healt diet with mostly veggies and exercising Metabolic panel is normal.

## 2023-04-03 ENCOUNTER — Ambulatory Visit

## 2023-04-23 ENCOUNTER — Other Ambulatory Visit: Payer: Self-pay

## 2023-04-23 DIAGNOSIS — R030 Elevated blood-pressure reading, without diagnosis of hypertension: Secondary | ICD-10-CM

## 2023-04-24 ENCOUNTER — Ambulatory Visit (INDEPENDENT_AMBULATORY_CARE_PROVIDER_SITE_OTHER)

## 2023-04-24 ENCOUNTER — Other Ambulatory Visit: Payer: Self-pay

## 2023-04-24 VITALS — BP 131/95 | HR 85 | Ht 65.0 in

## 2023-04-24 DIAGNOSIS — R030 Elevated blood-pressure reading, without diagnosis of hypertension: Secondary | ICD-10-CM | POA: Insufficient documentation

## 2023-04-24 DIAGNOSIS — I10 Essential (primary) hypertension: Secondary | ICD-10-CM | POA: Insufficient documentation

## 2023-04-24 DIAGNOSIS — R718 Other abnormality of red blood cells: Secondary | ICD-10-CM

## 2023-04-24 NOTE — Patient Instructions (Signed)
 recommends starting Losartan  25mg  as prescribed at last visit. Return in 2 weeks for nurse visit  for BP check and will need BMP at this visit as well.

## 2023-04-24 NOTE — Progress Notes (Signed)
   Established Patient Office Visit  Subjective   Patient ID: Melanie Glass, female    DOB: 1980-11-20  Age: 43 y.o. MRN: [id number]  Chief Complaint  Patient presents with   elevated BP without diagnosis of hypertension    BP check nurse visit.     HPI  Elevated BP without diagnosis of HTN. BP check nurse visit. Patient denies chest pain , shortness of breath,  palpitations, dizziness, headaches or medication problems. (Patient did not start Losartan  prescribed at last visit as she wanted to see what readings were like today without medicaiton)   ROS    Objective:     BP (!) 131/95   Pulse 85   Ht 5\' 5"  (1.651 m)   SpO2 98%   BMI 29.79 kg/m    Physical Exam   No results found for any visits on 04/24/23.    The 10-year ASCVD risk score (Arnett DK, et al., 2019) is: 0.4%    Assessment & Plan:  BP check nurse visit. Initial reading = 132/88. Second reading = 131/95. Per Dr. Greer Leak recommends starting Losartan  25mg  as prescribed at last visit. Return in 2 weeks for nurse visit  for BP check and will need BMP at this visit as well.  Problem List Items Addressed This Visit       Other   Elevated BP without diagnosis of hypertension - Primary    Return in about 2 weeks (around 05/08/2023) for BP check nurse visit and BMP .    Dickie Found, LPN

## 2023-04-25 ENCOUNTER — Encounter: Payer: Self-pay | Admitting: Family Medicine

## 2023-04-25 LAB — VITAMIN B12: Vitamin B-12: 543 pg/mL (ref 232–1245)

## 2023-04-25 LAB — FOLATE: Folate: 14.8 ng/mL (ref >3.0)

## 2023-04-25 NOTE — Progress Notes (Signed)
 Your lab work is within acceptable range and there are no concerning findings.   ?

## 2023-05-08 ENCOUNTER — Ambulatory Visit

## 2023-06-14 ENCOUNTER — Other Ambulatory Visit: Payer: Self-pay | Admitting: Family Medicine

## 2023-06-14 DIAGNOSIS — R030 Elevated blood-pressure reading, without diagnosis of hypertension: Secondary | ICD-10-CM

## 2023-06-15 NOTE — Telephone Encounter (Signed)
 Spoke with patient- wanting to know an ideal BP range to not need BP medication She states she has never taken the BP medication prescribed She has made diet and exercise modifications and has been getting home BP reading over the last month as below: 112/87, 120/85, 117/81, 108/80, 119/81, 118/79, 114/84, 113/81, 116/75, 115/77. She is wanting to know if the HTN nurse visit check is necessary as she has not been taking BP Medication at all and readings as above O.K. to leave a detailed voice mail message.

## 2023-06-15 NOTE — Telephone Encounter (Signed)
 Spoke with patient she is agreeable to scheduling a nurse visit BP check. She will bring in her home cuff for comparison.  She will call back to make the appointment as she will need to check her schedule at work .

## 2023-06-15 NOTE — Telephone Encounter (Signed)
 Lvm for pt

## 2023-06-15 NOTE — Telephone Encounter (Signed)
 Pls contact pt to schedule NV for HTN medication follow-up. Past due per note:  Return in about 2 weeks (around 05/08/2023) for BP check nurse visit and BMP . Sending 30 day med refill. Thx.

## 2023-06-15 NOTE — Telephone Encounter (Signed)
 Would recommend that she bring in her home cuff and still come in.  That way if the cuff is reading fairly accurately compared to our machine here we can feel pretty confident about those home numbers.  But it her noticed her blood pressure was still high when she went to see her OB/GYN.

## 2023-08-17 ENCOUNTER — Ambulatory Visit (INDEPENDENT_AMBULATORY_CARE_PROVIDER_SITE_OTHER)

## 2023-08-17 VITALS — BP 115/86 | HR 90 | Ht 65.0 in

## 2023-08-17 DIAGNOSIS — R030 Elevated blood-pressure reading, without diagnosis of hypertension: Secondary | ICD-10-CM | POA: Diagnosis not present

## 2023-08-17 NOTE — Progress Notes (Signed)
   Established Patient Office Visit  Subjective   Patient ID: Melanie Glass, female    DOB: 1980-10-11  Age: 43 y.o. MRN: [id number]  Chief Complaint  Patient presents with   Elevated BP without diagnosis of HTN    BP check - nurse visit=kph    HPI  Elevated BP without diagnosis of HTN . BP check nurse visit. Patient denies chest pain, shortness of breath, dizziness, palpitiation, vision changes or problems with medication.  Pateitn has been taking Losartan  25mg   for the past 2 weeks.  Will draw BMP today . Patient brought in her home BP cuff for review. Patient last BP reading  was 149/104 at [facility] dated 03/19/23.  ROS    Objective:     BP 115/86 Comment: home BP cuff = 122/90 sitting Left arm  Pulse 90   Ht 5' 5 (1.651 m)   SpO2 100%   BMI 29.79 kg/m    Physical Exam   No results found for any visits on 08/17/23.    The 10-year ASCVD risk score (Arnett DK, et al., 2019) is: 0.2%    Assessment & Plan:  Inittial reading  117/87 ( home BP cuff= 122/92), second reading  115/86 ( home BP cuff=122/90) - patient asked what is her goal BP reading ?- Per Vermell Bologna, PA the perfect BP reading is 120/80 and below-  and patient should aim to keep BP  less than 140/90.patient informed to schedule a 3 month follow up with Dr. Alvan.    Problem List Items Addressed This Visit       Other   Elevated BP without diagnosis of hypertension - Primary    Return in about 3 months (around 11/17/2023) for BP follow up with Dr. Alvan.    Suzen SHAUNNA Plenty, LPN

## 2023-08-17 NOTE — Patient Instructions (Signed)
 Return in 3 months for visit with Dr. Alvan

## 2023-08-18 LAB — BMP8+EGFR
BUN/Creatinine Ratio: 10 (ref 9–23)
BUN: 11 mg/dL (ref 6–24)
CO2: 19 mmol/L — ABNORMAL LOW (ref 20–29)
Calcium: 9.3 mg/dL (ref 8.7–10.2)
Chloride: 105 mmol/L (ref 96–106)
Creatinine, Ser: 1.07 mg/dL — ABNORMAL HIGH (ref 0.57–1.00)
Glucose: 115 mg/dL — ABNORMAL HIGH (ref 70–99)
Potassium: 4.8 mmol/L (ref 3.5–5.2)
Sodium: 142 mmol/L (ref 134–144)
eGFR: 66 mL/min/1.73 (ref >59)

## 2023-08-20 ENCOUNTER — Ambulatory Visit: Payer: Self-pay | Admitting: Family Medicine

## 2023-08-20 NOTE — Progress Notes (Signed)
 Hi Melanie Glass,  Kidney function is up slightly.  Your baseline is around 0.9, and now it is closer to 1.0.  Not a big difference just slight but I would like to check it again in 3 months instead of 6 months.

## 2023-08-28 ENCOUNTER — Telehealth: Payer: Self-pay

## 2023-08-28 DIAGNOSIS — Z0184 Encounter for antibody response examination: Secondary | ICD-10-CM

## 2023-08-28 NOTE — Telephone Encounter (Signed)
 Labs ordered she can come at her convenience.

## 2023-08-28 NOTE — Telephone Encounter (Signed)
 Patient requesting Hep B titers be drawn  as she does not have immunization records  Checked [facility] and no Hep B immunizations showing  IS  required for works  and needing by Thursday at 8:30 am if possible?

## 2023-08-28 NOTE — Telephone Encounter (Signed)
 Already sent to provider via separate telephone message.

## 2023-08-28 NOTE — Telephone Encounter (Signed)
 Copied from CRM #[id number]. Topic: Clinical - Medication Question >> Aug 28, 2023  1:18 PM Melanie Glass wrote: Reason for CRM: patient is requesting documentation for the Hepatitis B Vaccine. She tried looking on Mychart and can't find it. She needs this for her job. Call back# [phone]

## 2023-08-29 ENCOUNTER — Encounter: Payer: Self-pay | Admitting: Gastroenterology

## 2023-08-29 NOTE — Telephone Encounter (Signed)
 Patient informed.

## 2023-09-06 ENCOUNTER — Ambulatory Visit (INDEPENDENT_AMBULATORY_CARE_PROVIDER_SITE_OTHER)

## 2023-09-06 ENCOUNTER — Encounter (HOSPITAL_BASED_OUTPATIENT_CLINIC_OR_DEPARTMENT_OTHER): Payer: Self-pay | Admitting: Student

## 2023-09-06 ENCOUNTER — Ambulatory Visit (HOSPITAL_BASED_OUTPATIENT_CLINIC_OR_DEPARTMENT_OTHER): Admitting: Student

## 2023-09-06 DIAGNOSIS — S52502A Unspecified fracture of the lower end of left radius, initial encounter for closed fracture: Secondary | ICD-10-CM | POA: Diagnosis not present

## 2023-09-06 DIAGNOSIS — M25532 Pain in left wrist: Secondary | ICD-10-CM | POA: Diagnosis not present

## 2023-09-06 NOTE — Progress Notes (Signed)
 Chief Complaint: Left wrist pain     History of Present Illness:    Melanie Glass is a 43 y.o. right hand dominant female who presents today for evaluation of a left wrist injury.  She sustained a fall on an outstretched hand yesterday while rollerskating.  States that pain is noted in the wrist mainly with motion, particularly with wrist extension and when squeezing a closed fist.  She has been taking Tylenol and ibuprofen as well as using ice and a thumb spica brace.  Denies any numbness or tingling.  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   Surgical History:   None  PMH/PSH/Family History/Social History/Meds/Allergies:    Past Medical History:  Diagnosis Date   Allergy    Environmental and seasonal allergies    Flu    GERD (gastroesophageal reflux disease)    Uterine fibroid    Past Surgical History:  Procedure Laterality Date   INTRAUTERINE DEVICE (IUD) INSERTION     Social History   Socioeconomic History   Marital status: Married    Spouse name: Not on file   Number of children: Not on file   Years of education: Not on file   Highest education level: Professional school degree (e.g., MD, DDS, DVM, JD)  Occupational History   Not on file  Tobacco Use   Smoking status: Never   Smokeless tobacco: Never  Vaping Use   Vaping status: Never Used  Substance and Sexual Activity   Alcohol use: Yes    Comment: occ   Drug use: No   Sexual activity: Not on file  Other Topics Concern   Not on file  Social History Narrative   Not on file   Social Drivers of Health   Financial Resource Strain: Low Risk  (04/29/2023)   Received from [facility]   Overall Financial Resource Strain (CARDIA)    Difficulty of Paying Living Expenses: Not hard at all  Food Insecurity: No Food Insecurity (04/29/2023)   Received from [facility]   Hunger Vital Sign    Within the past 12 months, you worried that your food would run out before you got the  money to buy more.: Never true    Within the past 12 months, the food you bought just didn't last and you didn't have money to get more.: Never true  Transportation Needs: No Transportation Needs (04/29/2023)   Received from [facility] - Transportation    Lack of Transportation (Medical): No    Lack of Transportation (Non-Medical): No  Physical Activity: Sufficiently Active (04/29/2023)   Received from [facility]   Exercise Vital Sign    On average, how many days per week do you engage in moderate to strenuous exercise (like a brisk walk)?: 5 days    On average, how many minutes do you engage in exercise at this level?: 30 min  Recent Concern: Physical Activity - Insufficiently Active (03/18/2023)   Exercise Vital Sign    Days of Exercise per Week: 2 days    Minutes of Exercise per Session: 30 min  Stress: No Stress Concern Present (04/29/2023)   Received from [facility] - Occupational Stress Questionnaire    Feeling of Stress : Not at all  Social Connections: Socially Integrated (04/29/2023)  Received from [employer]   Social Network    How would you rate your social network (family, work, friends)?: Good participation with social networks   Family History  Problem Relation Age of Onset   Mental illness Mother    Hypertension Mother    Sleep apnea Mother    Hypertension Father    Sleep apnea Father    Pancreatic cancer Paternal Uncle    Stomach cancer Neg Hx    Colon cancer Neg Hx    Colon polyps Neg Hx    Esophageal cancer Neg Hx    Rectal cancer Neg Hx    No Known Allergies Current Outpatient Medications  Medication Sig Dispense Refill   levocetirizine (XYZAL) 5 MG tablet Take 5 mg by mouth every evening.     levonorgestrel (MIRENA) 20 MCG/24HR IUD 1 each by Intrauterine route once.     losartan  (COZAAR ) 25 MG tablet TAKE 1 TABLET (25 MG TOTAL) BY MOUTH DAILY. 30 tablet 0   pantoprazole  (PROTONIX ) 40 MG  tablet Take 1 tablet (40 mg total) by mouth daily. 90 tablet 3   No current facility-administered medications for this visit.   No results found.  Review of Systems:   A ROS was performed including pertinent positives and negatives as documented in the HPI.  Physical Exam :   Constitutional: NAD and appears stated age Neurological: Alert and oriented Psych: Appropriate affect and cooperative There were no vitals taken for this visit.   Comprehensive Musculoskeletal Exam:    Exam of the left wrist demonstrates no visible deformity.  Tenderness over the dorsal aspect of the distal radius.  Full range of motion with wrist flexion and bilateral deviation, with limited motion and wrist extension due to discomfort.  No snuffbox tenderness.  Able to form a composite fist.  Radial pulse 2+.  Distal sensation intact.  Imaging:   Xray (left wrist 4 views): Small, nondisplaced fracture of the distal radius that does not appear to extend intra-articularly.   I personally reviewed and interpreted the radiographs.   Assessment:   43 y.o. female with left wrist pain after sustaining a FOOSH injury yesterday.  On exam pain is confined to the area of the distal radius, which on x-rays does appear to demonstrate a small nondisplaced fracture.  I have recommended immobilization with use of a removable wrist brace which she can remove for hygiene.  I would like to see her back in another 3 to 4 weeks to repeat x-rays and ensure continued healing and potentially discuss discontinuation of the brace.  Plan :    - Continue wrist bracing and return to clinic in 3 to 4 weeks for repeat x-rays and reassessment     I personally saw and evaluated the patient, and participated in the management and treatment plan.  Leonce Reveal, PA-C Orthopedics

## 2023-09-26 ENCOUNTER — Ambulatory Visit (HOSPITAL_BASED_OUTPATIENT_CLINIC_OR_DEPARTMENT_OTHER): Admitting: Student

## 2023-11-05 ENCOUNTER — Encounter: Payer: Self-pay | Admitting: Radiology

## 2023-11-22 ENCOUNTER — Ambulatory Visit: Admitting: Family Medicine

## 2023-11-22 ENCOUNTER — Other Ambulatory Visit: Payer: Self-pay | Admitting: Gastroenterology

## 2023-11-22 VITALS — BP 130/80 | HR 71 | Ht 65.0 in | Wt 177.0 lb

## 2023-11-22 DIAGNOSIS — K219 Gastro-esophageal reflux disease without esophagitis: Secondary | ICD-10-CM

## 2023-11-22 DIAGNOSIS — I10 Essential (primary) hypertension: Secondary | ICD-10-CM | POA: Diagnosis not present

## 2023-11-22 DIAGNOSIS — Z23 Encounter for immunization: Secondary | ICD-10-CM | POA: Diagnosis not present

## 2023-11-22 DIAGNOSIS — R7989 Other specified abnormal findings of blood chemistry: Secondary | ICD-10-CM

## 2023-11-22 DIAGNOSIS — R7309 Other abnormal glucose: Secondary | ICD-10-CM

## 2023-11-22 MED ORDER — LOSARTAN POTASSIUM 25 MG PO TABS: 25.0000 mg | ORAL_TABLET | Freq: Every day | ORAL | 1 refills | Status: AC

## 2023-11-22 NOTE — Assessment & Plan Note (Signed)
 Gastroesophageal reflux disease (GERD) GERD managed with Protonix . Discussed endoscopy if symptoms worsen. - Continue daily Protonix . - Monitor for worsening symptoms and consider endoscopy if symptoms persist.

## 2023-11-22 NOTE — Addendum Note (Signed)
 Addended by: FREYA BASCOM CROME on: 11/22/2023 09:04 AM   Modules accepted: Orders

## 2023-11-22 NOTE — Progress Notes (Signed)
 Established Patient Office Visit  Patient ID: Melanie Glass, female    DOB: [birth year]  Age: 43 y.o. MRN: [id number] PCP: Alvan Dorothyann BIRCH, MD  Chief Complaint  Patient presents with   Hypertension    Subjective:     HPI  Discussed the use of AI scribe software for clinical note transcription with the patient, who gave verbal consent to proceed.  History of Present Illness Melanie Glass is a 43 year old female with hypertension who presents for blood pressure management.  Hypertension - Monitors blood pressure at home with systolic readings usually between 118-120 mmHg - Diastolic pressure often in the 80s, occasionally reaching the low 90s - Currently taking losartan  25 mg daily - No chest pain, palpitations, or peripheral edema - Expresses concern about kidney function due to a previous creatinine level of 1.0, increased from 0.9  Gastroesophageal reflux disease - Takes Protonix  daily - Symptoms are controlled with medication  Preventive health maintenance - Recently received influenza vaccination - Up to date with mammogram and Pap smear - Tetanus vaccination is due this month  Supplement use - Inquires about the safety of taking beetroot, turmeric, and ginger supplements     ROS    Objective:     BP 130/80   Pulse 71   Ht 5' 5 (1.651 m)   Wt 177 lb (80.3 kg)   SpO2 100%   BMI 29.45 kg/m    Physical Exam Vitals and nursing note reviewed.  Constitutional:      Appearance: Normal appearance.  HENT:     Head: Normocephalic and atraumatic.  Eyes:     Conjunctiva/sclera: Conjunctivae normal.  Cardiovascular:     Rate and Rhythm: Normal rate and regular rhythm.  Pulmonary:     Effort: Pulmonary effort is normal.     Breath sounds: Normal breath sounds.  Skin:    General: Skin is warm and dry.  Neurological:     Mental Status: She is alert.  Psychiatric:        Mood and Affect: Mood normal.      No results found for any visits on  11/22/23.    The 10-year ASCVD risk score (Arnett DK, et al., 2019) is: 1%    Assessment & Plan:   Problem List Items Addressed This Visit       Cardiovascular and Mediastinum   Hypertension   Relevant Medications   losartan  (COZAAR ) 25 MG tablet     Digestive   GASTROESOPHAGEAL REFLUX, NO ESOPHAGITIS   Gastroesophageal reflux disease (GERD) GERD managed with Protonix . Discussed endoscopy if symptoms worsen. - Continue daily Protonix . - Monitor for worsening symptoms and consider endoscopy if symptoms persist.      Other Visit Diagnoses       Encounter for immunization    -  Primary   Relevant Orders   Flu vaccine trivalent PF, 6mos and older(Flulaval,Afluria,Fluarix,Fluzone) (Completed)   Tdap vaccine greater than or equal to 7yo IM (Completed)     Elevated serum creatinine       Relevant Orders   Basic Metabolic Panel (BMET)     Abnormal glucose       Relevant Orders   Hemoglobin A1c       Assessment and Plan Assessment & Plan Hypertension Blood pressure elevated today. Discussed increasing losartan  or adding amlodipine to manage diastolic pressure. Target BP <130/85. - Rechecked blood pressure today. - Consider increasing losartan  to 50 mg if diastolic pressure remains elevated. - Consider adding amlodipine if losartan   adjustment is insufficient.  Renal function monitoring Slight increase in creatinine. Discussed monitoring kidney function with losartan  use. - Ordered blood test to monitor kidney function. - Checked for microalbuminuria to assess kidney stress.  General Health Maintenance Tetanus vaccination due. Mammogram and Pap smear up to date. Discussed supplement safety. - Administered tetanus vaccination. - Continue routine health screenings as scheduled. - Ensure safety of supplements with current medications.    Return in about 6 months (around 05/21/2024) for bp.    Dorothyann Byars, MD [facility] at  [facility]

## 2023-11-23 ENCOUNTER — Ambulatory Visit: Payer: Self-pay | Admitting: Family Medicine

## 2023-11-23 LAB — BASIC METABOLIC PANEL WITH GFR
BUN/Creatinine Ratio: 16 (ref 9–23)
BUN: 14 mg/dL (ref 6–24)
CO2: 24 mmol/L (ref 20–29)
Calcium: 9.8 mg/dL (ref 8.7–10.2)
Chloride: 104 mmol/L (ref 96–106)
Creatinine, Ser: 0.89 mg/dL (ref 0.57–1.00)
Glucose: 92 mg/dL (ref 70–99)
Potassium: 4.4 mmol/L (ref 3.5–5.2)
Sodium: 140 mmol/L (ref 134–144)
eGFR: 82 mL/min/1.73 (ref >59)

## 2023-11-23 LAB — MICROALBUMIN / CREATININE URINE RATIO
Creatinine, Urine: 139 mg/dL
Microalb/Creat Ratio: 6 mg/g{creat} (ref 0–29)
Microalbumin, Urine: 7.7 ug/mL

## 2023-11-23 LAB — HEMOGLOBIN A1C
Est. average glucose Bld gHb Est-mCnc: 105 mg/dL
Hgb A1c MFr Bld: 5.3 % (ref 4.8–5.6)

## 2023-11-23 NOTE — Progress Notes (Signed)
 Your A1c looks great no sign of diabetes or prediabetes.  Kidney function came back down to 0.8 which is great it was a slight bump up 3 months ago but it seems to be back into the normal range.

## 2023-11-23 NOTE — Progress Notes (Signed)
 No excess protein in the urine which is good.

## 2023-12-17 ENCOUNTER — Other Ambulatory Visit: Payer: Self-pay | Admitting: Gastroenterology

## 2024-01-20 ENCOUNTER — Other Ambulatory Visit: Payer: Self-pay | Admitting: Gastroenterology

## 2024-02-06 ENCOUNTER — Encounter: Payer: Self-pay | Admitting: Gastroenterology

## 2024-02-06 ENCOUNTER — Ambulatory Visit: Admitting: Gastroenterology

## 2024-02-06 VITALS — BP 122/78 | HR 79 | Ht 65.0 in | Wt 188.0 lb

## 2024-02-06 DIAGNOSIS — Z79899 Other long term (current) drug therapy: Secondary | ICD-10-CM

## 2024-02-06 DIAGNOSIS — K219 Gastro-esophageal reflux disease without esophagitis: Secondary | ICD-10-CM | POA: Diagnosis not present

## 2024-02-06 DIAGNOSIS — Z1211 Encounter for screening for malignant neoplasm of colon: Secondary | ICD-10-CM

## 2024-02-06 MED ORDER — PANTOPRAZOLE SODIUM 40 MG PO TBEC: 40.0000 mg | DELAYED_RELEASE_TABLET | Freq: Every day | ORAL | 11 refills | Status: AC

## 2024-02-06 NOTE — Patient Instructions (Addendum)
 Diagnosis code for insurance: GERD K 21.9  Follow up in 1 year  We have sent the following medications to your pharmacy for you to pick up at your convenience: Pantoprazole   _______________________________________________________  If your blood pressure at your visit was 140/90 or greater, please contact your primary care physician to follow up on this.  _______________________________________________________  If you are age 45 or older, your body mass index should be between 23-30. Your Body mass index is 31.28 kg/m. If this is out of the aforementioned range listed, please consider follow up with your Primary Care Provider.  If you are age 75 or younger, your body mass index should be between 19-25. Your Body mass index is 31.28 kg/m. If this is out of the aformentioned range listed, please consider follow up with your Primary Care Provider.   ________________________________________________________  The  GI providers would like to encourage you to use MYCHART to communicate with providers for non-urgent requests or questions.  Due to long hold times on the telephone, sending your provider a message by [facility] may be a faster and more efficient way to get a response.  Please allow 48 business hours for a response.  Please remember that this is for non-urgent requests.  _______________________________________________________  [facility] is using a team-based approach to care.  Your team is made up of your doctor and two to three APPS. Our APPS (Nurse Practitioners and Physician Assistants) work with your physician to ensure care continuity for you. They are fully qualified to address your health concerns and develop a treatment plan. They communicate directly with your gastroenterologist to care for you. Seeing the Advanced Practice Practitioners on your physician's team can help you by facilitating care more promptly, often allowing for earlier appointments, access to  diagnostic testing, procedures, and other specialty referrals.    I appreciate the  opportunity to care for you  Thank You   [facility]

## 2024-05-21 ENCOUNTER — Ambulatory Visit: Admitting: Family Medicine
